# Patient Record
Sex: Male | Born: 1980 | Race: White | Hispanic: No | Marital: Single | Smoking: Former smoker
Health system: Southern US, Community
[De-identification: ages and names within clinical notes are randomized; demographics above are authoritative.]

## PROBLEM LIST (undated history)

## (undated) DIAGNOSIS — K5792 Diverticulitis of intestine, part unspecified, without perforation or abscess without bleeding: Secondary | ICD-10-CM

## (undated) DIAGNOSIS — A0472 Enterocolitis due to Clostridium difficile, not specified as recurrent: Secondary | ICD-10-CM

## (undated) HISTORY — PX: SHOULDER OPEN ROTATOR CUFF REPAIR: SHX2407

## (undated) HISTORY — PX: APPENDECTOMY: SHX54

## (undated) HISTORY — PX: TONSILLECTOMY: SUR1361

---
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
## 2001-07-24 ENCOUNTER — Encounter: Admission: RE | Admit: 2001-07-24 | Discharge: 2001-07-24 | Payer: Self-pay | Admitting: *Deleted

## 2001-08-28 ENCOUNTER — Encounter: Admission: RE | Admit: 2001-08-28 | Discharge: 2001-08-28 | Payer: Self-pay | Admitting: *Deleted

## 2001-09-22 ENCOUNTER — Encounter: Admission: RE | Admit: 2001-09-22 | Discharge: 2001-09-22 | Payer: Self-pay | Admitting: Family Medicine

## 2001-10-19 ENCOUNTER — Encounter: Admission: RE | Admit: 2001-10-19 | Discharge: 2001-10-19 | Payer: Self-pay | Admitting: *Deleted

## 2002-07-09 ENCOUNTER — Inpatient Hospital Stay (HOSPITAL_COMMUNITY): Admission: EM | Admit: 2002-07-09 | Discharge: 2002-07-11 | Payer: Self-pay | Admitting: Psychiatry

## 2002-07-23 ENCOUNTER — Inpatient Hospital Stay (HOSPITAL_COMMUNITY): Admission: EM | Admit: 2002-07-23 | Discharge: 2002-07-24 | Payer: Self-pay | Admitting: Emergency Medicine

## 2002-07-24 ENCOUNTER — Inpatient Hospital Stay (HOSPITAL_COMMUNITY): Admission: EM | Admit: 2002-07-24 | Discharge: 2002-07-27 | Payer: Self-pay | Admitting: Psychiatry

## 2002-07-28 ENCOUNTER — Inpatient Hospital Stay (HOSPITAL_COMMUNITY): Admission: EM | Admit: 2002-07-28 | Discharge: 2002-07-30 | Payer: Self-pay | Admitting: *Deleted

## 2002-08-01 ENCOUNTER — Inpatient Hospital Stay (HOSPITAL_COMMUNITY): Admission: EM | Admit: 2002-08-01 | Discharge: 2002-08-04 | Payer: Self-pay | Admitting: Psychiatry

## 2002-08-10 ENCOUNTER — Encounter: Payer: Self-pay | Admitting: Emergency Medicine

## 2002-08-10 ENCOUNTER — Inpatient Hospital Stay (HOSPITAL_COMMUNITY): Admission: EM | Admit: 2002-08-10 | Discharge: 2002-08-13 | Payer: Self-pay | Admitting: Psychiatry

## 2002-09-18 ENCOUNTER — Emergency Department (HOSPITAL_COMMUNITY): Admission: EM | Admit: 2002-09-18 | Discharge: 2002-09-19 | Payer: Self-pay | Admitting: Emergency Medicine

## 2002-12-28 ENCOUNTER — Emergency Department (HOSPITAL_COMMUNITY): Admission: EM | Admit: 2002-12-28 | Discharge: 2002-12-28 | Payer: Self-pay

## 2002-12-28 ENCOUNTER — Encounter: Payer: Self-pay | Admitting: *Deleted

## 2003-01-24 ENCOUNTER — Emergency Department (HOSPITAL_COMMUNITY): Admission: EM | Admit: 2003-01-24 | Discharge: 2003-01-24 | Payer: Self-pay | Admitting: *Deleted

## 2003-08-21 ENCOUNTER — Emergency Department (HOSPITAL_COMMUNITY): Admission: AD | Admit: 2003-08-21 | Discharge: 2003-08-21 | Payer: Self-pay | Admitting: Family Medicine

## 2003-10-20 ENCOUNTER — Emergency Department (HOSPITAL_COMMUNITY): Admission: AD | Admit: 2003-10-20 | Discharge: 2003-10-21 | Payer: Self-pay | Admitting: Emergency Medicine

## 2004-09-04 ENCOUNTER — Emergency Department (HOSPITAL_COMMUNITY): Admission: EM | Admit: 2004-09-04 | Discharge: 2004-09-04 | Payer: Self-pay | Admitting: Family Medicine

## 2004-10-13 ENCOUNTER — Emergency Department (HOSPITAL_COMMUNITY): Admission: EM | Admit: 2004-10-13 | Discharge: 2004-10-13 | Payer: Self-pay | Admitting: Emergency Medicine

## 2005-04-19 ENCOUNTER — Emergency Department (HOSPITAL_COMMUNITY): Admission: EM | Admit: 2005-04-19 | Discharge: 2005-04-19 | Payer: Self-pay | Admitting: Family Medicine

## 2010-06-25 ENCOUNTER — Emergency Department (HOSPITAL_COMMUNITY): Admission: EM | Admit: 2010-06-25 | Discharge: 2010-06-25 | Payer: Self-pay | Admitting: Emergency Medicine

## 2010-11-25 LAB — CLOSTRIDIUM DIFFICILE EIA: C difficile Toxins A+B, EIA: NEGATIVE

## 2011-01-29 NOTE — Discharge Summary (Signed)
[name], [name] NO.:  [id number]   MEDICAL RECORD NO.:  [id number]                   PATIENT TYPE:  IPS   LOCATION:  0302                                 FACILITY:  [facility]   PHYSICIAN:  Geoffery Lyons, M.D.                   DATE OF BIRTH:  1981-06-03   DATE OF ADMISSION:  07/28/2002  DATE OF DISCHARGE:  07/30/2002                                 DISCHARGE SUMMARY   CHIEF COMPLAINT AND PRESENT ILLNESS:  This was the second admission to [facility] for this 30 year old single white male  voluntarily admitted.  He had a history of having an angry outburst at home  with his grandmother and fiancee.  He punched a hole in the wall.  He tried  to change his behavior but stated that his family was not.  He was very  discouraged, threatened to overdose but stated he would not.  He had no  history of physical violence.  Sleep had been fair.  Appetite had been good.  He denied any psychotic symptoms.  He promised safety.  He was recently  discharged three days prior to this admission for an overdose on Zoloft.   PAST PSYCHIATRIC HISTORY:  This was the second time at [facility].  He was discharged on Friday prior to this admission.  He had no  other admissions in the past.   SUBSTANCE ABUSE HISTORY:  He denied the use or abuse of any substances.   PAST MEDICAL HISTORY:  Noncontributory.   MEDICATIONS:  Trileptal 150 mg, had been compliant.   PHYSICAL EXAMINATION:  Physical examination was performed, failed to show  any acute findings.   MENTAL STATUS EXAM:  Mental status exam revealed an alert, young adult male,  cooperative, good eye contact, casually dressed.  Speech was clear.  Mood  was depressed and angry.  Affect was depressed.  Thought processes:  Coherent; no evidence of psychosis, no suicidal or homicidal ideas, no  auditory and visual hallucinations, no paranoia.  Cognitive: Cognition was  well  preserved.   ADMISSION DIAGNOSES:   AXIS I:  1. Depressive disorder, not otherwise specified.  2. Impulse control disorder, not otherwise specified.   AXIS II:  Rule out personality disorder, not otherwise specified.   AXIS III:  No diagnosis.   AXIS IV:  Moderate.   AXIS V:  Global assessment of functioning upon admission 35, highest global  assessment of functioning in the last year 65-70.   HOSPITAL COURSE:  He was admitted and started in intensive individual and  group psychotherapy.  He continued to work on mood and coping skills and  anger management.  He was aware that if he kept losing control, he might end  up hurting someone.  He worked on self, on Pharmacologist.  On November 17,  he was in full contact  with reality.  He was able to identify issues with  the grandmother that led to the outburst.  He felt that he has been  cooperative during the hospitalization.  He was willing to continue the  medication as well as work on anger management once discharged.  As he was  much more stable and had created more insight and had worked on his issues,  we discharged to outpatient followup.   DISCHARGE DIAGNOSES:   AXIS I:  1. Impulse control disorder, not otherwise specified.  2. Depressive disorder, not otherwise specified.   AXIS II:  Personality disorder, not otherwise specified.   AXIS III:  No diagnosis.   AXIS IV:  Moderate.   AXIS V:  Global assessment of functioning upon discharge 50-55.   DISCHARGE MEDICATIONS:  Trileptal 150 mg three times a day.   FOLLOW UP:  He was to follow up with Hipolito Bayley, M.D., and __________.                                                 Geoffery Lyons, M.D.    IL/MEDQ  D:  08/29/2002  T:  08/30/2002  Job:  [id number]

## 2011-01-29 NOTE — H&P (Signed)
NAMECHANCELLOR, VANDERLOOP NO.:  [id number]   MEDICAL RECORD NO.:  [id number]                   PATIENT TYPE:  IPS   LOCATION:  0302                                 FACILITY:  [facility]   PHYSICIAN:  Hipolito Bayley, M.D.               DATE OF BIRTH:  Jul 14, 1981   DATE OF ADMISSION:  07/28/2002  DATE OF DISCHARGE:                         PSYCHIATRIC ADMISSION ASSESSMENT   IDENTIFYING INFORMATION:  This is a 30 year old single white male  voluntarily admitted on July 28, 2002.   HISTORY OF PRESENT ILLNESS:  The patient presents with a history of having  an angry outburst at home with his grandmother and fiance.  He states he  punched three holes in the wall.  He states he is trying to change his  behavior but states that his family is not.  He feels very discouraged.  He  had threatened to overdose but states that he would not.  No history of  physical violence.  His sleep has been fair.  His appetite has been good.  He denies any psychotic symptoms.  Promises safety.  The patient was  recently discharged three days ago prior to this admission for an overdose  on Zoloft.   PAST PSYCHIATRIC HISTORY:  Second visit to [facility].  Recently discharged on Friday prior to this admission.  No other inpatient  admissions at other facilities.  He has a history of a suicide attempt where  he overdosed on Zoloft.  Since his last admission, he was supposed to see  Dr. Lourdes Sledge for follow-up.   SOCIAL HISTORY:  This is a 30 year old single white male.  Has a 61-year-old,  who is with the mother.  He reports he has a fiance who is expecting a  child.  She is currently three months pregnant.  Living with his  grandmother.  He works at [employer].  He has completed the 10th grade.  No  legal problems.   FAMILY HISTORY:  None.   ALCOHOL/DRUG HISTORY:  The patient smokes.  Denies any alcohol or substance  abuse.   PRIMARY CARE PHYSICIAN:  None.  States he  would go to Urgent Care for any  physical illnesses.   MEDICAL PROBLEMS:  None.   MEDICATIONS:  Was started on Trileptal 150 mg b.i.d.  States he has been  compliant.   ALLERGIES:  No known allergies.   PHYSICAL EXAMINATION:  Performed at last hospitalization when patient  overdosed with no significant findings.  The patient appears as a healthy  but somewhat overweight male without any complaints.  Vital signs were  temperature 98.71, heart rate 14, respirations 20, blood pressure 128/77.   MENTAL STATUS EXAM:  He is an alert, young adult male, cooperative with good  eye contact and casually dressed.  Speech is clear.  Mood is depressed and  angry.  Affect is depressed.  Thought processes are coherent.  There is  no  evidence of psychosis.  No suicidal or homicidal ideation.  No auditory or  visual hallucinations.  No paranoia.  Cognitive function intact.  Memory is  good.  Judgment poor.  Insight fair.  Poor impulse control.    DIAGNOSES:   AXIS I:  Depressive disorder not otherwise specified.   AXIS II:  Deferred.   AXIS III:  None.   AXIS IV:  Problems with primary support group, other psychosocial problems.   AXIS V:  Current 35; this past year 65-70.   PLAN:  Voluntary admission for depression and suicidal ideation, explosive  behavior.  Contract for safety.  Check every 15 minutes.  Will resume his  medications.  Will increase coping skills to manage behavior.  To follow up  with Dr. Lourdes Sledge.  To be medication-compliant.   TENTATIVE LENGTH OF STAY:  Two to three days.     Landry Corporal, N.P.                       Hipolito Bayley, M.D.    JO/MEDQ  D:  07/29/2002  T:  07/29/2002  Job:  [id number]

## 2011-01-29 NOTE — Discharge Summary (Signed)
NAMEBRANNAN, Tyler NO.:  [id number]   MEDICAL RECORD NO.:  [id number]                   PATIENT TYPE:  IPS   LOCATION:  0503                                 FACILITY:  [facility]   PHYSICIAN:  Geoffery Lyons, M.D.                   DATE OF BIRTH:  [birth year]   DATE OF ADMISSION:  08/10/2002  DATE OF DISCHARGE:  08/13/2002                                 DISCHARGE SUMMARY   CHIEF COMPLAINT AND PRESENT ILLNESS:  This was one of several admissions to  [facility] for this 30 year old white single male,  voluntarily admitted.  He had a bad day over the Thanksgiving holiday,  several stressors including conflict with his great grandmother and  girlfriend.  He reported to the nurse that he had been looking for a gun  prior to overdosing on Tylenol, which he at the end of the day.  He was  unable to cope with his feelings of frustration over a day of conflict.  He  did not take his medications all day.  He felt that they helped him to  maintain some stability.  He endorsed suicidal ideas.  He denied any  homicidal ideas, or auditory and visual hallucinations.   PAST PSYCHIATRIC HISTORY:  This was the fifth time at [facility] within the past 30 days.  He had previous history of suicide attempt.   SUBSTANCE ABUSE HISTORY:  There was no evidence of substance abuse.   PAST MEDICAL HISTORY:  He denied history of any major medical conditions.   MEDICATIONS:  Trileptal 300 mg three times a day; questionable compliance.   PHYSICAL EXAMINATION:  Physical examination was performed, failed to show  any acute findings.   MENTAL STATUS EXAM:  Mental status exam revealed a well nourished, well  developed, short, heavy built white male, fully alert, tattoos, no acute  distress, irritable, composed with appropriate affect.  Speech was normal.  Mood was euthymic and depressed.  Thought processes were dominated about  concerns that  everyone had caused him to be in an bad mood so that he was on  the unit.  He felt that he was doing well on the medication but got angry.  Thought process was logical; no evidence of homicidal ideation, no suicidal  ideas, no auditory and visual hallucinations.  Cognitive: Cognition was well  preserved.   ADMISSION DIAGNOSES:   AXIS I:  1. Mood disorder, not otherwise specified.  2. Impulse control disorder, not otherwise specified.   AXIS II:  Personality disorder, not otherwise specified, with borderline  features.   AXIS III:  1. Status post overdose.  2. Elevated liver enzymes.   AXIS IV:  Moderate.   AXIS V:  Global assessment of functioning upon admission 38, highest global  assessment of functioning in the last year 76.   LABORATORY DATA:  Other  laboratory workup: CBC was within normal limits.  Blood chemistries were within normal limits.  SGOT 75, SGPT 83.   HOSPITAL COURSE:  He was admitted and started in intensive individual and  group psychotherapy.  He was kept on his Trileptal that was increased to 300  mg twice a day and 450 mg at bedtime.  He was given a Z-Pak for upper  respiratory infection.  Just shortly after he was admitted, he wanted to  leave, the same attitude as seen before, not really willing to get into any  sort of treatment, claiming that his relatives had given him emergency  appointment last Sunday.  He was anxious, irritable, no insight.  Grandmother called here to state that he needed to stay longer but was not  wanting [name] to know that she had called.  He evidenced a lot of  manipulative behavior, minimized symptoms.  Family would not confront him  with any of the information that they were giving the staff but at the same  time, when he asked the family, they denied that they said anything.  We had  a family session.  He developed a cold which made him worse.  On December 1,  he was in full contact with reality, saying that he was not  going to hurt  himself.  Family told him that if he did this again, he would go to [facility].  There was an appointment with [facility].  The family was explained the terms of the commitment  criteria and that he might benefit from going to [facility] the  next time.   DISCHARGE DIAGNOSES:   AXIS I:  1. Mood disorder, not otherwise specified.  2. Impulse control, not otherwise specified.   AXIS II:  Personality disorder, not otherwise specified, with borderline  features.   AXIS III:  1. Status post overdose.  2. Increased liver enzymes.   AXIS IV:  Moderate.   AXIS V:  Global assessment of functioning upon discharge 60.   DISCHARGE MEDICATIONS:  1. Trileptal 300 mg three times a day.  2. Zithromax 250 mg one daily for four days.   FOLLOW UP:  He was to continue treatment at [facility] and Dr. Lang Snow.                                                 Geoffery Lyons, M.D.    IL/MEDQ  D:  09/26/2002  T:  09/26/2002  Job:  [id number]

## 2011-01-29 NOTE — H&P (Signed)
NAMEDENZIL, MCEACHRON NO.:  [id number]   MEDICAL RECORD NO.:  [id number]                   PATIENT TYPE:  IPS   LOCATION:  0302                                 FACILITY:  [facility]   PHYSICIAN:  Geoffery Lyons, M.D.                   DATE OF BIRTH:  1980/11/12   DATE OF ADMISSION:  08/01/2002  DATE OF DISCHARGE:                         PSYCHIATRIC ADMISSION ASSESSMENT   DATE OF ASSESSMENT/>  August 02, 2002   PATIENT IDENTIFICATION:  This is a 30 year old single male who is Caucasian,  voluntary admission.   HISTORY OF PRESENT ILLNESS:  This patient presented to the hospital with his  grandmother with whom he does not reside requesting admission, stating that  he could not be safe, fearing that he would harm himself.  Apparently he had  an anger outburst after they had been arguing and his grandmother had been  urging him to get a job and telling him that he could not continue to live  with his great-grandmother.  During his anger outburst, he became agitated,  he broke a door and was highly agitated.  During the admission process, he  became agitated and threatened the staff with violence and then subsequently  settled down and was cooperative.  The patient endorses angry feelings,  feeling out of control and frustrated during the argument.  He denies any  visual hallucinations or auditory hallucinations.  He reports that he has  been medication compliant since leaving [facility]  on November 17.   PAST PSYCHIATRIC HISTORY:  This is the patient's fourth admission to [facility].  The patient also has a history of prior  admissions to [facility] in 1999.  He does have a history of prior  suicide attempts by overdose and a history of impulsive anger.   SUBSTANCE ABUSE HISTORY:  The patient does smoke cigarettes but denies any  other substance abuse, no record of alcohol or other substance  use.   PAST MEDICAL HISTORY:  The patient has no regular primary care Dayson Aboud.  Medical problems: None; the patient denies any and claims that he is  generally healthy.   MEDICATIONS:  The only medication the patient takes is Trileptal 150 mg p.o.  t.i.d. and he claims he has been compliant with this.   DRUG ALLERGIES:  None.   REVIEW OF SYSTEMS:  Review of systems is remarkable for the patient's report  of some history of confusion and some short-term memory loss whenever he  gets agitated.  He gets a little confused about facts and feels that he  cannot remember things after episodes of agitation.  He denies any seizures  or blackouts or dizziness.  The patient denies any risk for sexually  transmitted diseases.  He states he has had some episodes of elevated blood  pressure in the past but not  regularly and has never been treated with  medications for this.   PHYSICAL EXAMINATION:  GENERAL:  This is a well nourished, well developed,  stocky built male who is medium complected.  Hygiene is good and  appropriate.  He is 5 feet 7 inches tall, weighs 224 pounds on admission.  He is fully alert.  VITAL SIGNS:  Temperature 97.6, pulse 106, blood pressure 138/90 upon  admission.  This morn ring his blood pressure is 131/68.  HEENT:  Head is normocephalic, atraumatic.  EENT: Exam is normal.  Sclerae  are nonicteric.  Hearing: Intact.  No rhinorrhea.  Oropharynx: In good  condition, noninjected.  NECK:  Supple, full range of motion, no thyromegaly.  CARDIOVASCULAR:  S1 and S2 heard; no clicks, murmurs, or gallops.  Heart  sounds are normal.  Radial pulse synchronous with apical pulse.  LUNGS:  Clear to auscultation.  ABDOMEN:  Rounded, quiet, nontender.  GENITALIA:  Deferred.  EXTREMITIES:  Without edema, warm and pink.  SKIN:  Medium tone.  The patient is heavily tattooed across his arms and  lower extremities.  No evidence of rashes.  NEUROLOGIC:  Cranial nerves II-XII are intact.   Grip strength is  symmetrical.  Motor movements are smooth.  Gait is normal with normal arm  swing.  Balance is good.  Exam is limited by the patient's cooperation.   LABORATORY DATA:  Diagnostic studies reveal essentially normal CBC with a  hemoglobin 17.4, hematocrit 51.3, platelets 341,000, MCV normal at 88.2.  Electrolytes are within normal limits.  BUN 10, creatinine 0.9.  Urinalysis  was normal.  Urine drug screen is currently pending.  We elected not to  repeat his thyroid screen, which was previously normal.   SOCIAL HISTORY:  The patient has a 10th grade education.  He was abandoned  by his parents when he was young and has been raised by his grandmother and  other relatives.  He currently resides in [place], [place], with  his great-grandmother and is employed on a variable basis.  He currently has  a girlfriend who is pregnant in her first trimester and he reported  considerable stress on previous admissions related to his concerns about  being a good father to this future child.  He does have a history of  speeding tickets and has lost his driver's license.   FAMILY HISTORY:  The patient denies any history of mental illness or  substance abuse.   MENTAL STATUS EXAM:  This is a overweight, large built male who is  attempting to be cooperative but is reclusive with poor eye contact.  He is  lying in bed with the covers up around his ears, keeps his face turned into  the pillow.  He warms as little bit with conversation but is generally  rather resistant to the interview.  Speech is normal.  Mood is irritable.  He is also somewhat embarrassed and ashamed of being here.  Thought process  is logical without psychosis; no evidence of suicidal ideation today, no  evidence of homicidal ideation.  He is not paranoid but is more embarrassed  about being here.  Thought content is dominated by his irritability with his family members who are insisting that he get a job and go  to work and also  his behavior at home, which has been rather angry and sullen from what we  can gather.  Cognitive: Intact and oriented x 3.  Intelligence is average.  Insight: Fair.  Judgment and  impulse control: Within normal limits.   ADMISSION DIAGNOSES:   AXIS I:  Mood disorder, not otherwise specified.   AXIS II:  Deferred.   AXIS III:  No diagnosis.   AXIS IV:  Moderate conflict with family.   AXIS V:  Current 38, past year 37.   INITIAL PLAN OF CARE:  Plan is to voluntarily admit the patient to evaluate  his suicidal ideation and his temper outbursts with a goal of alleviating  any suicidal ideation, strengthening his coping mechanism.  We have elected  to start him in intensive group and individual psychotherapy immediately and  he has not been yet participating in groups this morning and we will work on  that.  Meanwhile, we will check a urine drug screen on him which is  currently pending.  We plan on continuing his Trileptal at its current dose  of 150 mg p.o. t.i.d. as his only medication at this point but we will  evaluate the need for an antidepressant after we get some additional history  from his family.  Meanwhile, we are going to continue referrals to help him  with anger management and to monitor his medications on an ongoing basis.   ESTIMATED LENGTH OF STAY:  Two to three days.     Margaret A. Scott, N.P.                   Geoffery Lyons, M.D.    MAS/MEDQ  D:  08/03/2002  T:  08/03/2002  Job:  [id number]

## 2011-01-29 NOTE — Discharge Summary (Signed)
NAMESHAFIN, POLLIO NO.:  [id number]   MEDICAL RECORD NO.:  [id number]                   PATIENT TYPE:  IPS   LOCATION:  0500                                 FACILITY:  [facility]   PHYSICIAN:  Geoffery Lyons, M.D.                   DATE OF BIRTH:  [birth year]   DATE OF ADMISSION:  07/09/2002  DATE OF DISCHARGE:  07/11/2002                                 DISCHARGE SUMMARY   CHIEF COMPLAINT AND PRESENT ILLNESS:  This was the second admission to [facility] for this 30 year old single white male voluntarily  admitted.  He claimed that he was coming to get some help with counseling  and medication.  He called the police to have himself taken to [facility] requesting counseling as he was having suicidal thoughts and had to  resist the urge to jump out of a car.  Specific stressor is that his 17-year-  old girlfriend was pregnant.  He was frustrated with her waivering on her  commitment to him as well as her moodiness.  He was afraid he was going to  lose control.  Endorsed a week of intense worrying, initial and terminal  insomnia, sleeping 4-5 hours per night.  When arguing the last weekend  before the admission, he relapsed on alcohol, drinking a 12-pack of beer  after being sober for seven months.  Also relapsed on marijuana after having  none in 2-1/2 weeks.  Also endorsed depressed mood, irritability and anger.   PAST PSYCHIATRIC HISTORY:  Dr. Lourdes Sledge one year prior to this admission on  an outpatient basis.  Second time at [facility].  Had been in [facility] at age 26.  Previous history of cutting.  Has been on lithium and  BuSpar.   ALCOHOL/DRUG HISTORY:  Regular alcohol abuse and marijuana abuse.  Denies  any other substances.   PAST MEDICAL HISTORY:  Denies history of any major medical conditions.  He  was not taking any medications.   PHYSICAL EXAMINATION:  Performed and failed to show any acute findings.   MENTAL STATUS EXAM:  Overweight, short, stocky, young male with tattoos on  both arms.  Alert and in no acute distress.  Cooperative.  Speech was normal  and spontaneous.  Anxious to tell his story.  Mood was anxiety and  depression.  Affect was broad.  Thought process logical, coherent and  relevant.  No suicidal ideation.  No homicidal ideation.  Cognition well-  preserved.   ADMISSION DIAGNOSES:   AXIS I:  1. Major depression, single episode.  2. Marijuana and alcohol abuse.   AXIS II:  No diagnosis.   AXIS III:  No diagnosis.   AXIS IV:  Moderate.   AXIS V:  Global Assessment of Functioning upon admission 34; highest Global  Assessment of Functioning in the last year 68.   LABORATORY  DATA:  CBC showed hemoglobin to be 18.8.  Blood chemistries were  within normal limits.  Thyroid profile was within normal limits.  Drug  screen was negative for substances of abuse.   HOSPITAL COURSE:  He was placed on Zoloft 25 mg per day as he tolerated the  Zoloft.  The plan was to increase to 50 mg.  He was involved in the  individual and group psychotherapy.  He gained some more insight.  Admitted  he was feeling overwhelmed with a lot of stress.  He felt very overwhelmed  but, at the time of this evaluation, he denied any suicidal ideation.  He  felt like he needed to get out of the hospital as he had to go back to work  and he just got a job at [employer] and, if he was not there on that  day, he was going to lose his job.  He said he had a hard time landing this  job so he wanted to be out.  He evidenced  increased insight on his  situation, has worked on Pharmacologist, was willing to see Dr. Lourdes Sledge.  As he was guarantee safety and he had a follow-up plan in place, had  increased insight and coping skills, we went ahead and discharged to  outpatient follow-up.   DISCHARGE DIAGNOSES:   AXIS I:  1. Major depression, single episode.  2. Alcohol and marijuana abuse.    AXIS II:  No diagnosis.   AXIS III:  No diagnosis.   AXIS IV:  Moderate.   AXIS V:  Global Assessment of Functioning upon discharge 60-65.   DISCHARGE MEDICATIONS:  Zoloft 25 mg per day to increase up to 50 mg per  day.   FOLLOW UP:  Dr. Lourdes Sledge at the [facility] Outpatient  Department.                                               Geoffery Lyons, M.D.    IL/MEDQ  D:  08/07/2002  T:  08/08/2002  Job:  [id number]

## 2011-01-29 NOTE — Discharge Summary (Signed)
NAMEGARRICK, [name] NO.:  [id number]   MEDICAL RECORD NO.:  [id number]                   PATIENT TYPE:  IPS   LOCATION:  0301                                 FACILITY[facility]  [facility]   PHYSICIAN:  Carolanne Grumbling, M.D.                 DATE OF BIRTH:  1981-02-25   DATE OF ADMISSION:  07/24/2002  DATE OF DISCHARGE:  07/27/2002                                 DISCHARGE SUMMARY   PATIENT IDENTIFICATION:  The patient is a 30 year old male.   INITIAL ASSESSMENT AND DIAGNOSIS:  The patient was admitted after he had  taken an overdose of Zoloft.  He reported he had an argument with his  girlfriend, and consequently took the overdose.  He had just been in this  hospital two weeks prior to this admission for similarly impulsive actions.  He said at the time of admission that he did not need to be here, that it  was a waist of time, he was not suicidal, and he was ready to go home by the  time he was admitted to the hospital.   HOSPITAL COURSE:  He was only in the hospital for three days.  He  insistently denied any suicidal ideation.  Each day was an effort to talk  him out of trying to leave and wondering why he could not leave since he was  not suicidal but he seemed to know hospital regulations very well but he was  at least kept until three days.  At that point, because he had made no  suicidal thoughts, he did have a session with his girlfriend and his  grandmother and both of them were comfortable having him home again, he was  discharged home.   MENTAL STATUS EXAM:  Mental status at the time of the initially evaluation  revealed an alert, oriented young man, who was appropriately dressed and  groomed.  Speech was normal.  Mood was irritable.  He seemed to be logical  and goal directed.  There was no evidence of any hallucinations or other  psychosis.  He seemed to be at least average intelligence.   ADMISSION DIAGNOSES:   AXIS I:  1. Mood disorder, not  otherwise specified.  2. Rule out bipolar disorder.   AXIS II:  Deferred.   AXIS III:  Status post suicidal attempt.   AXIS IV:  Moderate.   AXIS V:  30/74.   All indicated laboratory examinations were within normal limits or  noncontributory.   POST-HOSPITAL CARE PLANS:  He was referred back to Dr. Lourdes Sledge, who is his  outpatient doctor.  At the time of discharge he was taking Trileptal 150 mg  twice daily. There were no restrictions based on his habit, on his diet or  his activity.    FINAL DIAGNOSES:   AXIS I:  Mood disorder, not otherwise specified.   AXIS II:  Rule out  personality disorder.   AXIS III:  Healthy.   AXIS IV:  Moderate.   AXIS V:  50/60.                                                   Carolanne Grumbling, M.D.    GT/MEDQ  D:  08/13/2002  T:  08/13/2002  Job:  [id number]

## 2011-01-29 NOTE — H&P (Signed)
[name], [name] NO.:  [id number]   MEDICAL RECORD NO.:  [id number]                   PATIENT TYPE:  IPS   LOCATION:  0500                                 [facility]ACILITY[facility]  [facility]   PHYSICIAN:  [name], M.D.              DATE OF BIRTH:  1981-03-10   DATE OF ADMISSION:  07/09/2002  DATE OF DISCHARGE:                         PSYCHIATRIC ADMISSION ASSESSMENT   IDENTIFYING INFORMATION:  This is a 30 year old single white male who is a  voluntary admission.   CHIEF COMPLAINT:  I came here to get some counseling and some help with  medications.   HISTORY OF PRESENT ILLNESS:  This patient called the police to have himself  taken to [name] yesterday requesting some counseling  and help after he had had suicidal thoughts over the weekend and then had to  resist the urge to jump out of a car.  He found out within the past two  weeks that his 42 year old girlfriend is pregnant.  She is waivering on her  commitment to him and he is frustrated with her moodiness and change ability  and feels at a loss to control the situation.  He attributes some of this to  immaturity on her part but is clearly frustrated with his situation.  He  endorses one week of intense worry, initial and terminal insomnia, sleeping  only about 4-5 hours per night.  When he was arguing with her this past  weekend, he relapsed on alcohol, drinking a 12-pack of beer after being  sober for several months.  He also relapsed on marijuana after having none  in 2-1/2 weeks.  He had previously used marijuana over the past few years on  a regular basis.  He denies other substance abuse.  The patient also  endorses depressed mood and intense frustration and irritability with  anxiety over the past week.  He denies auditory or visual hallucinations or  alcohol withdrawal symptoms.  The patient also denies past history of manic  behaviors or elevated mood.   PAST  PSYCHIATRIC HISTORY:  The patient has no current [name].  He has seen  Dr. Lourdes Stephenson approximately one year ago on an outpatient basis here at  [name].  This is the patient's second  [name] admission.  He has a history of a prior admission  approximately four years ago at age 63 for reasons that are unclear and  another prior hospitalization to [name] at age 68.  He does have  some history of cutting himself previously but denies that he has done any  of this recently.  He has been previously diagnosed with bipolar disorder  and, at one time, was managed on lithium and BuSpar, which he has not taken  in over a year.  He reports that he has taken medications in the past, which  caused him to gain  considerable weight and one of his priorities is to avoid  medications that cause weight gain.   SOCIAL HISTORY:  This is a single white male with a 10th grade education.  He has been raised by his grandparents after being abandoned by his parents  at one point in his life.  He recently suffered the death of his great-  grandfather in August of 2003, which he claims was a serious emotional loss  for him.  He lacks close family support and feels he is unable to talk to  his grandparents about things of deep concern to him.  He does have one  child, who is approximately 70 years old, who lives with the child's mother.  The patient feels that he is somehow repeating history by again having a  girlfriend, who is pregnant, and he fears that this girlfriend will somehow,  because of her inconsistency, keep him from being able to be a good father  to this child.  The patient does have a history of some previous speeding  tickets and is currently without a driver's license.   FAMILY HISTORY:  Unclear.   ALCOHOL/DRUG HISTORY:  The patient has a history of regular alcohol abuse  and regular marijuana abuse but he denies ever having any withdrawal   symptoms from alcohol.  He denies abusing any other substances or opiates.   PAST MEDICAL HISTORY:  The patient has no regular primary care [name].  He  denies any current medical problems.   CURRENT MEDICATIONS:  Denies.   ALLERGIES:  None.   POSITIVE PHYSICAL FINDINGS:  The patient's full PE is pending.  GENERAL:  He is a stocky-built, healthy-appearing, 30 year old male, who was  heavily tattooed along his arms, forearms and his neck.  VITAL SIGNS:  On admission to the unit, temperature 97.5, pulse 114,  respirations 20, blood pressure 125/88.  He is approximately 5 feet 7 inches  tall and weighs 226 pounds as of this morning.   LABORATORY DATA:  Hemoglobin elevated at 18.4, hematocrit elevated at 55.1.  The patient does admit that he has not been drinking fluids adequately.  His  WBC is elevated at 14,900.  No differential is available at this time.  The  patient denies any somatic symptoms.  Denies any fevers, chills or signs of  infections.  Platelet count is 428,000.  Electrolytes are within normal  limits with potassium of 4.5, sodium of 141, BUN 9, creatinine 1.2.  Liver  enzymes are within normal limits with a total bilirubin of 0.7.  SGOT 25,  SGPT 40.  His thyroid panel is currently pending.   MENTAL STATUS EXAM:  This is an overweight, short, stocky, young male who  appears to be his stated age of 57.  He is fully alert and in no acute  distress.  He is cooperative and polite with a good focus and is able to  track conversation well.  His affect is full.  Speech is normal and  spontaneous.  He seems quite anxious to tell his story and his concerns  about his relationship with his girlfriend and his frustrations with her  moodiness.  Mood is mildly depressed.  His thought process is logical  without deficits.  No evidence of homicidal ideation.  He does have vague suicidal ideation without any specific plan.  No evidence of psychosis.  His  thought content is dominated  by his concerns that this pregnancy is  repeating history for him and that he may not  have a chance to be a good  father to this child as he feels deprived from the opportunity to be a good  husband and father with the previous pregnancy.  Cognitively, he is intact  and oriented x 3.  Intelligence is average to above average.  Insight fair.  Impulse control and judgment within normal limits.   DIAGNOSES:   AXIS I:  1. Adjustment reaction with depressed mood.  2. Cannabis abuse.   AXIS II:  Deferred.   AXIS III:  None.   AXIS IV:  Moderate to severe (conflict with his girlfriend with lack of  adequate primary support).   AXIS V:  Current 34; past year 18.   PLAN:  Voluntarily admit the patient with 15-minute checks in place with our  goal to alleviate his suicidal ideation.  We have elected to enroll him  immediately in intensive group therapy along with individual psychotherapy  and he has been participating fully today.  We have also elected to start  him on Zoloft 25 mg p.o. daily and he is tolerating this well.  We have  talked about the risks and benefits of this medication, including its  potential for sexual side effects and weight gain and he is in agreement to  go ahead and start this medication.  We will also provide him with Seroquel  25 mg p.o. q.6h. p.r.n. should he have any additional agitation but he has  not required p.r.n. doses at this time.  He also is allowed Ambien 10 mg  q.h.s. p.r.n. basis for insomnia.  We, at this point, are going to presume  that his leukocytosis is secondary to some mild dehydration since he has no  focal symptoms and no sides of fever or infection clinically.  So, at this  point, we will not proceed with any further testing of this unless he shows  elevated temperature or other clinical signs.   ESTIMATED LENGTH OF STAY:  Three to four days.     Margaret A. Stephannie Peters                   [name], M.D.    MAS/MEDQ  D:   07/10/2002  T:  07/10/2002  Job:  [id number]

## 2011-01-29 NOTE — Discharge Summary (Signed)
[name], [name] NO.:  [id number]   MEDICAL RECORD NO.:  [id number]                   PATIENT TYPE:  INP   LOCATION:  3316                                 FACILITY:  [facility]   PHYSICIAN:  Fransisco Hertz, M.D.               DATE OF BIRTH:  06-Mar-1981   DATE OF ADMISSION:  07/23/2002  DATE OF DISCHARGE:  07/24/2002                                 DISCHARGE SUMMARY   CONSULTATIONS:  Antonietta Breach, M.D., psychiatry.   DISCHARGE DIAGNOSES:  1. Zoloft overdose.  2. Major depressive disorder, recurrent, severe.  3. Polysubstance dependence.   DISCHARGE MEDICATIONS:  Per psychiatry.   DISPOSITION:  The patient will be transferred and involuntarily committed to  [facility] for further evaluation and management.  He will  be followed there by the psychiatric team.  Pending discharge from the  psychiatric unit, the patient has been given the number to the outpatient  clinic and is instructed that he can call to set up an inpatient appointment  with Dr. Kennith Gain at his convenience.   Issues for followup include further evaluation and management of patient's  psychiatric conditions.   PROCEDURES:  None.   BRIEF HISTORY:  The patient is a 30 year old Caucasian male with an  extensive past psychiatric history including depression, questionable  bipolar disorder, questionable borderline personality, and questionable ADHD  who was brought to the emergency department by EMS secondary to an overdose  of Zoloft.  The patient reports that he was at home the morning of admission  when he got into an argument with his girlfriend on the telephone.  The  patient stated that he was tired and dealing with it, stopped, and  impulsively took approximately forty 50 mg tablets of Zoloft.  Reportedly he  was caught by his maternal grandmother who called EMS.  The patient was,  therefore, transferred to the [facility] Emergency Room for  further  evaluation.   The patient reported feeling a jittery sensation with heart racing and mild  abdominal pain but denied any other symptoms at the time of the initial  evaluation.  Of note, the patient reported many stressors in life including  frequent arguments with his family and his girlfriend.  He stated that his  girlfriend was pregnant with his child and did not listen to him.  Per the  patient, the majority of his actions recently have been impulsive.  He often  acts out and occasionally has rage, but he denied any homicidal ideations.  He is uncertain if he was actually attempting to hurt and/or kill himself by  taking these medications.   PHYSICAL EXAMINATION ON ADMISSION:  VITAL SIGNS:  Temperature 98.3, pulse  115, blood pressure 132/70, respiratory rate 18, O2 saturation 97% on room  air.  GENERAL:  The patient was mildly agitated.  There no acute distress.  He was  alert and oriented x 3.  HEENT:  Pupils are equal, round, and reactive to light and accommodation.  Extraocular muscles intact.  Sclerae clear.  Nares patent.  TMs clear  bilaterally.  Oropharynx had evidence of charcoal.  NECK:  No lymphadenopathy, JVD, or bruits appreciated.  LUNGS:  Clear to auscultation bilaterally.  CARDIOVASCULAR:  Tachycardic with regular rhythm.  There were no murmurs,  gallops, or rubs appreciated.  ABDOMEN:  Mildly obese.  Soft, nondistended with mild tenderness to  palpation diffusely.  There was no rebound or guarding.  EXTREMITIES:  Without clubbing, cyanosis, or edema.  Good capillary refill.  SKIN:  The patient has multiple tattoos distributed throughout his upper  extremities.  LYMPH:  No lymphadenopathy in cervical or axillary regions.  NEUROLOGIC:  Cranial nerves II-XII intact.  No focal nor sensory deficits  appreciated.  The patient demonstrated 5/5 strength throughout.  PSYCHIATRIC:  A 30 year old Caucasian male who appeared his stated age.  He  was dressed in a  hospital gown at the time of exam.  His behavior was  appropriate.  His speech was normal but somewhat monotone.  His mood was  described as edgy.  His affect was somewhat constricted.  His thought  process was within normal limits.  Thought content was within normal limits,  though his insight and judgment were poor.  His concentration was  reasonable.   ADMISSION LABORATORY DATA:  White blood cell count 12.3, hemoglobin 10.4,  hematocrit 32.9, platelets 353.  Sodium 141, potassium 4.2, chloride 104,  bicarb 28, BUN 9, creatinine 0.9, glucose 138.  Total bilirubin 1.5,  alkaline phosphatase 77, AST 22, ALT 32, protein 7.9, albumin 4.3, calcium  9.7.  Acetaminophen level was less than 10.  Salicylate was less than 4.  Urine drug screen was negative.  TCA was Negative.  ETOH was negative.  Urinalysis was normal.   EKG revealed sinus tachycardia with normal axis and intervals.  There was no  QT prolongation.  There were no ST changes consistent with ischemia.   HOSPITAL COURSE:  1. ZOLOFT OVERDOSE:  The patient was monitored closely in the stepdown unit     for signs and symptoms of overdose including agitation, drowsiness,     confusion, ataxia, vertigo, tremor, delusions, hallucinations, nausea,     vomiting, seizures, and tachycardia.  The patient did not give a history     of any other ingestion.  He was not felt to be high risk of serotonin     syndrome; however, these symptoms were also monitored.  The patient did     quite well overnight without any complications secondary to the overdose.     The following morning, he was felt to be medically stable.  He did     undergo a psychiatric evaluation by Dr. Jeanie Sewer.  It was Dr.     Providence Crosby impression that he did indeed have major depressive disorder,     recurrent, severe, and was still at risk to harm himself.  Dr. Jeanie Sewer     performed a petition for commitment to allow for further inpatient    enfolding environment and  further evaluation and therapy of his major     depressive disorder and suicide attempt.  Therefore, the patient will be     transferred to [facility] for further management.    DISCHARGE LABORATORY DATA:  Available labs on this patient were drawn on  07/24/2002 and were as follows.  White blood cell count 10.9,  hemoglobin  16.6, hematocrit 48.2, platelets 346.  Sodium 139, potassium 3.5, chloride  105, bicarb 27, BUN 7, creatinine 0.8, glucose 110, calcium 8.7, total  bilirubin 0.9.  Other hepatic function tests were within normal limits.     Kennith Gain, M.D.                  Fransisco Hertz, M.D.    CM/MEDQ  D:  07/24/2002  T:  07/24/2002  Job:  [id number]   cc:   Outpatient Clinic   Fransisco Hertz, M.D.  [address]  [place] [address]  Fax: [phone]   Antonietta Breach, M.D.  [address]  [place], [place] [address]  Fax: [phone]

## 2011-01-29 NOTE — Discharge Summary (Signed)
NAME:  [name], [name] NO.:  [id number]   MEDICAL RECORD NO.:  [id number]                   PATIENT TYPE:  IPS   LOCATION:  0302                                 FACILITY:  [facility]   PHYSICIAN:  Jeanice Lim, M.D.              DATE OF BIRTH:  11-Mar-1981   DATE OF ADMISSION:  08/01/2002  DATE OF DISCHARGE:  08/04/2002                                 DISCHARGE SUMMARY   IDENTIFYING DATA:  This is a 30 year old single male, voluntarily admitted,  feeling  that he could not be safe, feared that he would hurt himself and  had anger outbursts and broke a door at his grandmother's house.   MEDICATIONS:  Trileptal 150 mg t.i.d.   ALLERGIES:  No known drug allergies.   PHYSICAL EXAMINATION:  Essentially within normal limits. Neurologically  nonfocal.   LABORATORY DATA:  Routine admission labs were within normal limits.   MENTAL STATUS EXAM:  An overweight, large built, cooperative  male,  reclusive, poor eye contact. Speech within normal limits. Mood irritable.  Affect restricted. Thought process is goal directed. Thought content  negative for psychotic symptoms or dangerous ideation. Cognitively intact.  Judgment and insight fair to poor.   ADMISSION DIAGNOSES:   AXIS I:  Mood disorder.   AXIS II:  None.   AXIS III:  None.   AXIS IV:  Moderate conflict with family and support system.   AXIS V:  38/70.   HOSPITAL COURSE:  The patient was admitted where and we ordered routine  p.r.n.  medications. He underwent further management. He was  recommended  for anger management. He seemed to benefit from  therapy and medication  changes. The patient was continued on Trileptal and Trileptal was optimized  to a more therapeutic dose. A family meeting to discuss aftercare planning  was completed and the patient's condition on discharge was markedly  improved.   Mood was more euthymic, affect brighter, thought process is goal directed,  thought content  negative for any dangerous ideation or psychotic symptoms.  The patient felt he could much better control his impulses and anger.   DISCHARGE MEDICATIONS:  Trileptal 300 mg t.i.d.   FOLLOW UP:  He was to follow up with [facility] and Dr. Lourdes Sledge on August 08, 2002, at 11 a.m.    DISCHARGE DIAGNOSES:   AXIS I:  Mood disorder.   AXIS II:  None.   AXIS III:  None.   AXIS IV:  Moderate conflict with family and support system.   AXIS VKallie Locks, M.D.    JEM/MEDQ  D:  08/15/2002  T:  08/15/2002  Job:  [id number]

## 2011-01-29 NOTE — H&P (Signed)
[name], [name] NO.:  [id number]   MEDICAL RECORD NO.:  [id number]                   PATIENT TYPE:  IPS   LOCATION:  0503                                 FACILITY:  [facility]   PHYSICIAN:  Geoffery Lyons, M.D.                   DATE OF BIRTH:  1981/01/03   DATE OF ADMISSION:  08/10/2002  DATE OF DISCHARGE:                         PSYCHIATRIC ADMISSION ASSESSMENT   IDENTIFYING INFORMATION:  This is a 30 year old white male, who is single,  voluntary admission.   HISTORY OF PRESENT ILLNESS:  This patient reports that he had a bad day over  the Thanksgiving holiday with several stressors, including conflict with his  great-grandmother and with his girlfriend.  One of relatives reported to the  nurses that he had been looking for a gun prior to overdosing on Tylenol,  which he states that he did at the end of the day because he just felt  unable to cope with his feelings of frustration after a day of conflict.  The patient reports that he did not take his medicines all day, although he  generally feels that they help him maintain some stability and focus.  He  endorses suicidal ideation today.  He denies any homicidal ideation or  auditory or visual hallucinations.   PAST PSYCHIATRIC HISTORY:  This is the fifth [place] inpatient admission within the past 30 days.  The  patient has a history of prior suicide attempts by overdose.   SOCIAL HISTORY:  The patient is an unemployed, 30 year old, white male who  is single.  He has a child who is approximately 49 years old by another  girlfriend who does not live with him.  He has a girlfriend who is currently  pregnant in her second trimester with his second child.  The patient  currently lives in his great-grandmother's home.  He has no history of legal  charges.   FAMILY HISTORY:  Unclear.   ALCOHOL AND DRUG HISTORY:  The patient is a nonsmoker.  He denies  any  history of substance abuse.   PAST MEDICAL HISTORY:  The patient has no regular primary care [facility].  He  denies any current medical problems, however, it is status post  acetaminophen overdose and it was treated in the emergency room.   MEDICATIONS:  Trileptal 300 mg p.o. t.i.d.  His compliance is questionable.   ALLERGIES:  No known drug allergies.   PHYSICAL EXAMINATION:  The patient's physical exam was done in the emergency  room and is documented in the medical record.  The patient is without any  nausea or vomiting today.  His vital signs are within normal limits.  He is  5 feet 7 inches tall and weighs 220 pounds.   LABORATORY DATA:  The patient's acetaminophen level was originally 53.9 in  the emergency room.  His salicylate  level was less than 4.  The patient's  metabolic panel reveals electrolytes within normal limits.  His glucose was  mildly elevated at 134.  Initially his SGOT was elevated at 183 and SGPT at  128.  His CBC showed a mildly elevated white count of 12.5, hemoglobin 16.6,  and hematocrit 49.0.  He had mildly elevation of his neutrophils at 9600  absolute count.  The patient's alcohol level was less than 5.  The second  acetaminophen level done an hour and 15 minutes later showed that his  acetaminophen level had decreased to 30.3.   MENTAL STATUS EXAMINATION:  This is a well-nourished, well-developed, short,  heavily-built, white male who is fully alert.  He is in no acute distress.  He has a somewhat blunted irritable effect.  He is in full control of his  faculties.  He is composed with an appropriate affect, although it is a bit  irritable.  Speech is normal.  Mood is euthymic.  The patient's thought  content is dominated by his concerns that everyone else has caused him to be  in a bad mood and resulted in his being here.  He reports that he has been  feeling that he was doing well on the medications and then is angry because  family members at  home have had issues with his behavior and have placed  demands on him.  Thought process is logical.  He is in full control of  himself.  No evidence of homicidal ideation or auditory or visual  hallucinations.  No psychosis.  No homicidal ideation.  He is positive for  suicidal ideation without any particular plan.  Cognitively he is intact and  oriented x 3.  Intelligence is average.  Insight is poor.  Impulse control  poor.  Judgment questionable.   AXIS I:  Mood disorder not otherwise specified.   AXIS II:  Deferred.   AXIS III:  Status post acetaminophen overdose and elevated liver enzymes not  otherwise specified.   AXIS IV:  Deferred.   AXIS V:  Global Assessment of Functioning:  Current 30, past year 68.   PLAN:  Voluntarily admit the patient with 15-minute checks in place.  We are  going to ask the case manager to evaluate his family's concerns.  Meanwhile,  we will continue his Trileptal 300 mg p.o. t.i.d. for now and will do a  recheck on his liver panel along with pro time, partial thromboplastin time,  and check his INR in the wake of his Tylenol overdose.  The estimated length  of stay is five days.     Margaret A. Scott, N.P.                   Geoffery Lyons, M.D.    MAS/MEDQ  D:  08/11/2002  T:  08/11/2002  Job:  [id number]

## 2012-02-26 ENCOUNTER — Encounter (HOSPITAL_COMMUNITY): Payer: Self-pay

## 2012-02-26 ENCOUNTER — Emergency Department (HOSPITAL_COMMUNITY)
Admission: EM | Admit: 2012-02-26 | Discharge: 2012-02-26 | Disposition: A | Discharge status: Home / Self Care | Payer: Medicaid Other | Attending: Emergency Medicine | Admitting: Emergency Medicine

## 2012-02-26 DIAGNOSIS — Y838 Other surgical procedures as the cause of abnormal reaction of the patient, or of later complication, without mention of misadventure at the time of the procedure: Secondary | ICD-10-CM | POA: Insufficient documentation

## 2012-02-26 DIAGNOSIS — IMO0002 Reserved for concepts with insufficient information to code with codable children: Secondary | ICD-10-CM | POA: Insufficient documentation

## 2012-02-26 HISTORY — DX: Enterocolitis due to Clostridium difficile, not specified as recurrent: A04.72

## 2012-02-26 MED ORDER — BACITRACIN ZINC 500 UNIT/GM EX OINT
TOPICAL_OINTMENT | CUTANEOUS | Status: AC
  Filled 2012-02-26: qty 2.7

## 2012-02-26 MED ORDER — NAPROXEN 500 MG PO TABS: 500.0000 mg | ORAL_TABLET | Freq: Two times a day (BID) | ORAL | Status: DC

## 2012-02-26 MED ORDER — BACITRACIN 500 UNIT/GM EX OINT
1.0000 "application " | TOPICAL_OINTMENT | Freq: Two times a day (BID) | CUTANEOUS | Status: DC
  Filled 2012-02-26 (×4): qty 0.9

## 2012-02-26 MED ORDER — BACITRACIN ZINC 500 UNIT/GM EX OINT: TOPICAL_OINTMENT | Freq: Two times a day (BID) | CUTANEOUS | Status: AC

## 2012-02-26 NOTE — ED Notes (Signed)
Place where he had surgery several years ago did not heal right and tonight it bursted open and started squirting blood per s/o.

## 2012-02-26 NOTE — ED Provider Notes (Signed)
History     CSN: [id number]  Arrival date & time 02/26/12  0224   First MD Initiated Contact with Patient 02/26/12 0232      Chief Complaint  Patient presents with  . Wound Check    (Consider location/radiation/quality/duration/timing/severity/associated sxs/prior treatment) HPI Comments: 31 year old male status post appendectomy in the last couple of years. He presents with a recurrent bleeding wound in his suprapubic area of his abdomen. According to the patient and his family member this has had several episodes of draining bloody material over the last several months. It is mild, intermittent, and recurrent.  He states that over several days he will have an increased amount of pain that culminates in the rupture of this wound followed by a bloody nonpurulent non-foul odor smelling drainage. He denies spreading redness or tenderness around the wound and has no vomiting or fevers. He states this was a surgical site for his appendectomy and feels that it never healed right. He has not followed up with his general surgeon for this  Patient is a 31 y.o. male presenting with wound check. The history is provided by the patient and the spouse.  Wound Check     Past Medical History  Diagnosis Date  . C. difficile colitis     Past Surgical History  Procedure Date  . Appendectomy     History reviewed. No pertinent family history.  History  Substance Use Topics  . Smoking status: Never Smoker   . Smokeless tobacco: Not on file  . Alcohol Use: Yes      Review of Systems  Constitutional: Negative for fever.  Gastrointestinal: Negative for vomiting.  Skin: Positive for wound.    Allergies  Review of patient's allergies indicates no known allergies.  Home Medications   Current Outpatient Rx  Name Route Sig Dispense Refill  . BACITRACIN ZINC 500 UNIT/GM EX OINT Topical Apply topically 2 (two) times daily. 15 g 0  . NAPROXEN 500 MG PO TABS Oral Take 1 tablet (500 mg total)  by mouth 2 (two) times daily with a meal. 30 tablet 0    BP 133/75  Pulse 84  Temp 98 F (36.7 C) (Oral)  Resp 16  Ht 5\' 5"  (1.651 m)  Wt 225 lb (102.059 kg)  BMI 37.44 kg/m2  SpO2 96%  Physical Exam  Nursing note and vitals reviewed. Constitutional: He appears well-developed and well-nourished.  HENT:  Head: Normocephalic and atraumatic.  Eyes: Conjunctivae are normal. No scleral icterus.  Pulmonary/Chest: Effort normal and breath sounds normal.  Abdominal: Soft. There is no tenderness.       Abdomen is soft and nontender, in the suprapubic area there is a small draining wound, see skin section  Musculoskeletal: Normal range of motion. He exhibits no edema and no tenderness.  Skin:       0.5 cm draining seroma in the suprapubic area, no surrounding induration tenderness fluctuance. Material or foul odor.    ED Course  Procedures (including critical care time)  Labs Reviewed - No data to display No results found.   1. Seroma       MDM  Overall the patient appears well appearing, vital signs are normal and he has a focal what appears to be draining seroma in the suprapubic area. I am unable to express any further fluid from this open wound, I have asked the nurse to apply bacitracin and a sterile dressing and have instructed the patient to do the same for the next week. Prescriptions  given for Naprosyn, bacitracin, warm soaks in the followup list of family doctors in the community. 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 At this time I do not feel that oral antibiotics are needed for what does not appear to be a cellulitis or an abscess.  Discharge Prescriptions include:  Naprosyn Bacitracin         Vida Roller, MD 02/26/12 [phone]

## 2012-02-26 NOTE — Discharge Instructions (Signed)
The bleeding has likely occurred because of a nonhealing seroma. This is a collection of sterile fluid and blood that occurs underneath the skin after surgery. This usually goes away by itself especially if it is draining spontaneously. Please use the topical antibiotic cream as prescribed, take warm soaks in the bathtub twice a day and followup with a family Dr. If you do not have one, see the list below. It would probably be beneficial to followup with her general surgeon in the next 2 weeks if this does not heal.  RESOURCE GUIDE  Chronic Pain Problems: Contact [facility]  [phone] Patients need to be referred by their primary care doctor.  Insufficient Money for Medicine: Contact United Way:  call "211" or [facility] [phone].  No Primary Care Doctor: - Call [facility]  [phone] - can help you locate a primary care doctor that  accepts your insurance, provides certain services, etc. - Physician Referral Service- [phone]  Agencies that provide inexpensive medical care: - [facility]  [phone] - [facility]  [phone] - [facility]  [phone] - Women's Clinic  [phone] - Planned Parenthood  [phone] [facility]  [phone]  Medicaid-accepting [facility] Providers: - [facility]- [address]  [phone], Mon-Fri 9am-7pm, Sat 9am-1pm - [facility]- [address] [place], [address] [place]  [phone] - [facility]- [address] [place]  [phone] [facility]- [address]  [phone] - Renaye Rakers- [address] [place], [address], [phone]  Only accepts [place] patients after they have their name  applied to their card  Self Pay (no insurance) in [place]: - Sickle Cell Patients: Dr Willey Blade, [facility]  [address] [place], [phone] - [facility]- [address] [place]  [phone]       [facility]- [address]       -     [facility]- see information above (Speak to [employer] if you do not have insurance)       -  [facility]- [address] [place], [phone]       -  [facility]- [address],  [phone]       -  [facility]- [address], [phone]       -  Dr Julio Sicks-  [address], [place], [phone]       -  [facility]- [address], [phone]       -  [facility]- [address] [phone], also [address], [phone]       -    [facility]- [address] [place], [phone], 1st & 3rd Saturday   every month, 10am-1pm  1) Find a Doctor and Pay Out of Pocket Although you won't have to find out who is covered by your insurance plan, it is a good idea to ask around and get recommendations. You will then need to call the office and see if the doctor you have chosen will accept you as a new patient and what types of options they offer for patients who are self-pay. Some doctors offer discounts or will set up payment plans for their  patients who do not have insurance, but you will need to ask so you aren't surprised when you get to your appointment.  2) Contact Your Local Health Department Not all health departments have doctors that can see patients for sick visits, but many do, so it is worth a call to see if yours does. If you don't know where your local health department is, you can check in your phone book. The CDC also has a tool to help you locate your state's health department, and many state websites also have listings of all of their local health departments.  3) Find a Walk-in Clinic If your illness is not likely to be very severe or complicated, you may want to try a walk in clinic. These are popping up all over the country in pharmacies, drugstores, and shopping  centers. They're usually staffed by nurse practitioners or physician assistants that have been trained to treat common illnesses and complaints. They're usually fairly quick and inexpensive. However, if you have serious medical issues or chronic medical problems, these are probably not your best option  STD Testing - [facility] Department of [facility], STD Clinic, [address], [place], phone [phone] or [phone].  Monday - Friday, call for an appointment. [facility] Department of [employer], STD Clinic, [address], [place], phone [phone] or [phone].  Monday - Friday, call for an appointment.  Abuse/Neglect: [facility] Child Abuse Hotline (248)885-5915 [facility] Child Abuse Hotline 615-470-5396 (After Hours)  Emergency Shelter:  [facility] 321-876-6653  Maternity Homes: - Room at the [facility] 315-870-2074 - [facility] ([phone]  MRSA Hotline #:   223-105-7335  [facility] Resources  [facility] [place]  United Way [facility] Dept. [address]                 [address]         [address] [place] [address]  Blondell Reveal Phone:  [phone]                                  Phone:  [phone]                   Phone:  [phone]  [facility], [phone] - [phone]       -     [facility] in [place], [address],                                  [phone], Insurance  [place] Child Abuse Hotline 820-190-4081 or 931-266-2756 (After Hours)   Behavioral Health Services  Substance Abuse Resources: - [facility] and Drug Services  773-578-6237 - [facility] ([phone] - [facility]  [phone] Floydene Flock [phone] - Residential & Outpatient Substance Abuse Program  [phone]  Psychological Services: [facility]  ([phone] Services  3104802249 - [facility], [phone] [place]. [address] [place], ACCESS LINE: [phone] or 7152222223, [employer]ur[employer]an.co.za  Dental Assistance  If unable to pay or uninsured, contact:  [facility] or [facility]. to become qualified for the adult dental clinic.  Patients with Medicaid: [facility] 4788160684 [address], [phone] [address], [phone]  If unable to pay, or uninsured, contact [facility] [phone]) or [facility] [phone] in [place], [phone] in [facility]) to become qualified for the adult dental clinic  Other Low-Cost Community Dental Services: - [facility]- [address] [place], [place], [place], [address], [phone], [address]. [address], 2nd and 4th Thursday of the month at 6:30am.  10 clients each day by appointment, can sometimes see walk-in patients if someone does not show for an appointment. [facility]- [address] [place], [place], [address], [phone] - [facility]- [address], [place], [place], [address], [phone] - [facility]- [phone] [facility] Department- [phone] [facility] Department- [phone]

## 2012-02-26 NOTE — ED Notes (Signed)
Education provided on wound care and on sx to return to MD if wound gets worse or pt starts having fevers

## 2012-02-26 NOTE — ED Notes (Signed)
MD at bedside. 

## 2012-02-26 NOTE — ED Notes (Addendum)
Wound low abd Drained per pt and spouse; Area tender

## 2012-09-02 ENCOUNTER — Encounter (HOSPITAL_COMMUNITY): Payer: Self-pay | Admitting: Emergency Medicine

## 2012-09-02 ENCOUNTER — Emergency Department (HOSPITAL_COMMUNITY)
Admission: EM | Admit: 2012-09-02 | Discharge: 2012-09-02 | Disposition: A | Discharge status: Home / Self Care | Payer: Medicaid Other | Attending: Emergency Medicine | Admitting: Emergency Medicine

## 2012-09-02 DIAGNOSIS — R202 Paresthesia of skin: Secondary | ICD-10-CM

## 2012-09-02 DIAGNOSIS — Z9889 Other specified postprocedural states: Secondary | ICD-10-CM | POA: Insufficient documentation

## 2012-09-02 DIAGNOSIS — F17200 Nicotine dependence, unspecified, uncomplicated: Secondary | ICD-10-CM | POA: Insufficient documentation

## 2012-09-02 DIAGNOSIS — Z8719 Personal history of other diseases of the digestive system: Secondary | ICD-10-CM | POA: Insufficient documentation

## 2012-09-02 DIAGNOSIS — R209 Unspecified disturbances of skin sensation: Secondary | ICD-10-CM | POA: Insufficient documentation

## 2012-09-02 MED ORDER — NAPROXEN 500 MG PO TABS: 500.0000 mg | ORAL_TABLET | Freq: Two times a day (BID) | ORAL | Status: DC

## 2012-09-02 NOTE — ED Notes (Signed)
Reports onset Monday of right arm/hand numbness/ tension from rotor cuff to hands. 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 Worse pain rated 8-9/10 constant 7/10 worse in AM. 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 This week noted reoccurring chest pain described as a cramp 8/10 duration 5-78min's. Denies any other accompanied symptoms.

## 2012-09-02 NOTE — ED Provider Notes (Signed)
Medical screening examination/treatment/procedure(s) were performed by non-physician practitioner and as supervising physician I was immediately available for consultation/collaboration.    Jammal Sarr R Pegi Milazzo, MD 09/02/12 1619 

## 2012-09-02 NOTE — ED Notes (Signed)
Pt presents right hand numbness onset since Monday, denies injury, worse when lying down. Pt states "tension" from rotator cuff to tips of fingers.

## 2012-09-02 NOTE — ED Provider Notes (Signed)
History     CSN: [id number]  Arrival date & time 09/02/12  0714   First MD Initiated Contact with Patient 09/02/12 [phone]      Chief Complaint  Patient presents with  . Hand Pain    (Consider location/radiation/quality/duration/timing/severity/associated sxs/prior treatment) HPI [name] is a 31 y.o. male who presents with complaint of numbness and tingling in right hand. States started about a week ago. It is constant now, states worse when laying down, better when walking around. No particular distribution, states "all over hand." States feels "tension" in arm shoulder down as well. Denies neck or shoulder pain or injury. Denies weakness in hands. States feeling sharp pain in his hand. 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 Denies hx of the same.  Not taking any medications for this.    Past Medical History  Diagnosis Date  . C. difficile colitis     Past Surgical History  Procedure Date  . Appendectomy   . Tonsillectomy     No family history on file.  History  Substance Use Topics  . Smoking status: Current Every Day Smoker -- .2 years  . Smokeless tobacco: Never Used  . Alcohol Use: No      Review of Systems  Constitutional: Negative for fever and chills.  HENT: Negative for neck pain and neck stiffness.   Respiratory: Negative.   Cardiovascular: Negative.   Musculoskeletal: Negative.   Skin: Negative.   Neurological: Positive for numbness. Negative for weakness.    Allergies  Review of patient's allergies indicates no known allergies.  Home Medications   Current Outpatient Rx  Name  Route  Sig  Dispense  Refill  . NAPROXEN 500 MG PO TABS   Oral   Take 1 tablet (500 mg total) by mouth 2 (two) times daily with a meal.   30 tablet   0     BP 133/82  Pulse 71  Temp 97.7 F (36.5 C) (Oral)  Resp 16  SpO2 97%  Physical Exam  Nursing note and vitals reviewed. Constitutional: He is oriented to person, place, and time.  He appears well-developed and well-nourished. No distress.  HENT:  Head: Normocephalic.  Eyes: Conjunctivae normal are normal.  Neck: Normal range of motion. Neck supple.       No midline tenderness over cervical spine  Cardiovascular: Normal rate, regular rhythm and normal heart sounds.   Pulmonary/Chest: Effort normal and breath sounds normal. No respiratory distress. He has no wheezes. He has no rales.  Musculoskeletal:       Full ROM of right  shoulder, elbow, wrist. Full rom of all fingers of right hand. PT able to spread all fingers, do "thumbs up," make a fist. Grip strength 5/5 and equal bilaterally. Normal sensation over all fingers, normal two point discrimination over all finger tips. Normal distal radial pulses bilaterally  Neurological: He is alert and oriented to person, place, and time.  Skin: Skin is warm and dry.  Psychiatric: He has a normal mood and affect. His behavior is normal.    ED Course  Procedures (including critical care time)   Date: 09/02/2012  Rate: 73  Rhythm: normal sinus rhythm  QRS Axis: normal  Intervals: normal  ST/T Wave abnormalities: normal  Conduction Disutrbances: none  Narrative Interpretation:   Old EKG Reviewed: No old     1. Paresthesia of hand       MDM  Pt with right hand numbness and tingling. Normal exam today.  i suspect his symptoms are due to either cervical or brachial plexus injury/inflammation. He does do a lot of physical work on his job. I will start him on NSAIDs, follow up with orthopedics. He will need further testing if symptoms continue. This discussed with pt, he agrees with the plan. Pt denied any chest pain to me, states some discomfort, that he thinks were cramps on and off over last week. Sound very atypical. ECG normal. VS normal.    Filed Vitals:   09/02/12 0723  BP: 133/82  Pulse: 71  Temp: 97.7 F (36.5 C)  Resp: [address] Shevawn Langenberg, PA 09/02/12 0843  Lottie Mussel,  PA 09/02/12 [phone]

## 2013-10-07 ENCOUNTER — Emergency Department (HOSPITAL_COMMUNITY)
Admission: EM | Admit: 2013-10-07 | Discharge: 2013-10-07 | Disposition: A | Discharge status: Home / Self Care | Payer: No Typology Code available for payment source | Attending: Emergency Medicine | Admitting: Emergency Medicine

## 2013-10-07 ENCOUNTER — Encounter (HOSPITAL_COMMUNITY): Payer: Self-pay | Admitting: Emergency Medicine

## 2013-10-07 ENCOUNTER — Emergency Department (HOSPITAL_COMMUNITY): Payer: No Typology Code available for payment source

## 2013-10-07 DIAGNOSIS — S01512A Laceration without foreign body of oral cavity, initial encounter: Secondary | ICD-10-CM

## 2013-10-07 DIAGNOSIS — Y92410 Unspecified street and highway as the place of occurrence of the external cause: Secondary | ICD-10-CM | POA: Insufficient documentation

## 2013-10-07 DIAGNOSIS — Y9389 Activity, other specified: Secondary | ICD-10-CM | POA: Insufficient documentation

## 2013-10-07 DIAGNOSIS — S0990XA Unspecified injury of head, initial encounter: Secondary | ICD-10-CM | POA: Insufficient documentation

## 2013-10-07 DIAGNOSIS — S01501A Unspecified open wound of lip, initial encounter: Secondary | ICD-10-CM | POA: Insufficient documentation

## 2013-10-07 DIAGNOSIS — S161XXA Strain of muscle, fascia and tendon at neck level, initial encounter: Secondary | ICD-10-CM

## 2013-10-07 DIAGNOSIS — Z23 Encounter for immunization: Secondary | ICD-10-CM | POA: Insufficient documentation

## 2013-10-07 DIAGNOSIS — Z8619 Personal history of other infectious and parasitic diseases: Secondary | ICD-10-CM | POA: Insufficient documentation

## 2013-10-07 DIAGNOSIS — Z87891 Personal history of nicotine dependence: Secondary | ICD-10-CM | POA: Insufficient documentation

## 2013-10-07 DIAGNOSIS — S139XXA Sprain of joints and ligaments of unspecified parts of neck, initial encounter: Secondary | ICD-10-CM | POA: Insufficient documentation

## 2013-10-07 MED ORDER — OXYCODONE-ACETAMINOPHEN 5-325 MG PO TABS: 1.0000 | ORAL_TABLET | ORAL | Status: DC | PRN

## 2013-10-07 MED ORDER — AMOXICILLIN 500 MG PO CAPS
1000.0000 mg | ORAL_CAPSULE | Freq: Once | ORAL | Status: AC
  Administered 2013-10-07: 1000 mg via ORAL
  Filled 2013-10-07: qty 2

## 2013-10-07 MED ORDER — IBUPROFEN 800 MG PO TABS
800.0000 mg | ORAL_TABLET | Freq: Once | ORAL | Status: AC
  Administered 2013-10-07: 800 mg via ORAL
  Filled 2013-10-07: qty 1

## 2013-10-07 MED ORDER — TETANUS-DIPHTH-ACELL PERTUSSIS 5-2.5-18.5 LF-MCG/0.5 IM SUSP
0.5000 mL | Freq: Once | INTRAMUSCULAR | Status: AC
  Administered 2013-10-07: 0.5 mL via INTRAMUSCULAR
  Filled 2013-10-07: qty 0.5

## 2013-10-07 MED ORDER — AMOXICILLIN 500 MG PO TABS: 1000.0000 mg | ORAL_TABLET | Freq: Two times a day (BID) | ORAL | Status: DC

## 2013-10-07 NOTE — ED Provider Notes (Signed)
Medical screening examination/treatment/procedure(s) were conducted as a shared visit with non-physician practitioner(s) and myself.  I personally performed the initial history and physical. Nonphysician practitioner was only involved in the care to do repair of intraoral laceration.   Dione Boozeavid Leo Weyandt, MD 10/07/13 [phone]
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
## 2013-10-07 NOTE — ED Notes (Signed)
Pt given d/c instructions and verbalized understanding. NAD at this time.  
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
## 2013-10-07 NOTE — ED Notes (Signed)
Pt transported to CT ?
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
## 2013-12-20 ENCOUNTER — Emergency Department (INDEPENDENT_AMBULATORY_CARE_PROVIDER_SITE_OTHER)
Admission: EM | Admit: 2013-12-20 | Discharge: 2013-12-20 | Disposition: A | Discharge status: Home / Self Care | Payer: Self-pay | Source: Home / Self Care | Attending: Family Medicine | Admitting: Family Medicine

## 2013-12-20 ENCOUNTER — Encounter (HOSPITAL_COMMUNITY): Payer: Self-pay | Admitting: Emergency Medicine

## 2013-12-20 DIAGNOSIS — F411 Generalized anxiety disorder: Secondary | ICD-10-CM

## 2013-12-20 DIAGNOSIS — F419 Anxiety disorder, unspecified: Secondary | ICD-10-CM

## 2013-12-20 DIAGNOSIS — K219 Gastro-esophageal reflux disease without esophagitis: Secondary | ICD-10-CM

## 2013-12-20 MED ORDER — CLONAZEPAM 0.5 MG PO TABS: 0.5000 mg | ORAL_TABLET | Freq: Two times a day (BID) | ORAL | Status: DC | PRN

## 2013-12-20 MED ORDER — RANITIDINE HCL 150 MG PO CAPS: 150.0000 mg | ORAL_CAPSULE | Freq: Two times a day (BID) | ORAL | Status: DC

## 2013-12-20 NOTE — Discharge Instructions (Signed)
Follow up with a regular doctor if your symptoms continue.  If you feel your symptoms improve with the clonazepam, talk with your doctor about this as well for further treatment.    Gastroesophageal Reflux Disease, Adult Gastroesophageal reflux disease (GERD) happens when acid from your stomach goes into your food pipe (esophagus). The acid can cause a burning feeling in your chest. Over time, the acid can make small holes (ulcers) in your food pipe.  HOME CARE  Ask your doctor for advice about:  Losing weight.  Quitting smoking.  Alcohol use.  Avoid foods and drinks that make your problems worse. You may want to avoid:  Caffeine and alcohol.  Chocolate.  Mints.  Garlic and onions.  Spicy foods.  Citrus fruits, such as oranges, lemons, or limes.  Foods that contain tomato, such as sauce, chili, salsa, and pizza.  Fried and fatty foods.  Avoid lying down for 3 hours before you go to bed or before you take a nap.  Eat small meals often, instead of large meals.  Wear loose-fitting clothing. Do not wear anything tight around your waist.  Raise (elevate) the head of your bed 6 to 8 inches with wood blocks. Using extra pillows does not help.  Only take medicines as told by your doctor.  Do not take aspirin or ibuprofen. GET HELP RIGHT AWAY IF:   You have pain in your arms, neck, jaw, teeth, or back.  Your pain gets worse or changes.  You feel sick to your stomach (nauseous), throw up (vomit), or sweat (diaphoresis).  You feel short of breath, or you pass out (faint).  Your throw up is green, yellow, black, or looks like coffee grounds or blood.  Your poop (stool) is red, bloody, or black. MAKE SURE YOU:   Understand these instructions.  Will watch your condition.  Will get help right away if you are not doing well or get worse. Document Released: 02/16/2008 Document Revised: 11/22/2011 Document Reviewed: 03/19/2011 [facility] Patient Information 2014  [place]Care, [place].

## 2013-12-20 NOTE — ED Notes (Signed)
Patient reports a 3 week history of feeling a lump in his throat, onset 3 weeks ago.  Feels lump in throat with eating and drinking, feels like choking sensation when lying down.  Intermittently feels arms/legs numb and tingling and chest pain.  No pain currently.

## 2013-12-21 ENCOUNTER — Encounter (HOSPITAL_COMMUNITY): Payer: Self-pay | Admitting: Emergency Medicine
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
## 2013-12-23 NOTE — ED Provider Notes (Signed)
Medical screening examination/treatment/procedure(s) were performed by resident physician or non-physician practitioner and as supervising physician I was immediately available for consultation/collaboration.   Cathe Bilger DOUGLAS MD.   Adellyn Capek D Lasheena Frieze, MD 12/23/13 1020 

## 2016-02-09 ENCOUNTER — Ambulatory Visit (INDEPENDENT_AMBULATORY_CARE_PROVIDER_SITE_OTHER): Payer: Self-pay

## 2016-02-09 ENCOUNTER — Encounter (HOSPITAL_COMMUNITY): Payer: Self-pay | Admitting: Emergency Medicine

## 2016-02-09 ENCOUNTER — Ambulatory Visit (HOSPITAL_COMMUNITY)
Admission: EM | Admit: 2016-02-09 | Discharge: 2016-02-09 | Disposition: A | Discharge status: Home / Self Care | Payer: Self-pay | Attending: Family Medicine | Admitting: Family Medicine

## 2016-02-09 DIAGNOSIS — S61432A Puncture wound without foreign body of left hand, initial encounter: Secondary | ICD-10-CM

## 2016-02-09 MED ORDER — HYDROCODONE-ACETAMINOPHEN 5-325 MG PO TABS: 1.0000 | ORAL_TABLET | ORAL | Status: DC | PRN

## 2016-02-09 MED ORDER — CEPHALEXIN 500 MG PO CAPS: 500.0000 mg | ORAL_CAPSULE | Freq: Four times a day (QID) | ORAL | Status: DC

## 2016-02-09 NOTE — ED Provider Notes (Signed)
CSN: [id number]     Arrival date & time 02/09/16  1307 History   First MD Initiated Contact with Patient 02/09/16 1434     Chief Complaint  Patient presents with  . Puncture Wound   (Consider location/radiation/quality/duration/timing/severity/associated sxs/prior Treatment) HPI History obtained from patient:  Pt presents with the cc of:  Puncture wound left hand Duration of symptoms: About 1 hour ago Treatment prior to arrival: Bandage applied at home Context: Drilled into the palm of his hand quarter inch drill bit. Other symptoms include: Just pain Pain score: 4 FAMILY HISTORY: No significant family history.    Past Medical History  Diagnosis Date  . C. difficile colitis    Past Surgical History  Procedure Laterality Date  . Appendectomy    . Tonsillectomy     History reviewed. No pertinent family history. Social History  Substance Use Topics  . Smoking status: Former Smoker -- .2 years  . Smokeless tobacco: Never Used  . Alcohol Use: No    Review of Systems  Denies: HEADACHE, NAUSEA, ABDOMINAL PAIN, CHEST PAIN, CONGESTION, DYSURIA, SHORTNESS OF BREATH  Allergies  Review of patient's allergies indicates no known allergies.  Home Medications   Prior to Admission medications   Medication Sig Start Date End Date Taking? Authorizing Provider  amoxicillin (AMOXIL) 500 MG tablet Take 2 tablets (1,000 mg total) by mouth 2 (two) times daily. 10/07/13   Dione Booze, MD  clonazePAM (KLONOPIN) 0.5 MG tablet Take 1 tablet (0.5 mg total) by mouth 2 (two) times daily as needed for anxiety. 12/20/13   Cathlyn Parsons, NP  oxyCODONE-acetaminophen (PERCOCET) 5-325 MG per tablet Take 1 tablet by mouth every 4 (four) hours as needed. 10/07/13   Dione Booze, MD  ranitidine (ZANTAC) 150 MG capsule Take 1 capsule (150 mg total) by mouth 2 (two) times daily. 12/20/13   Cathlyn Parsons, NP   Meds Ordered and Administered this Visit  Medications - No data to display  BP 130/78 mmHg  Pulse  104  Temp(Src) 98.6 F (37 C) (Oral)  Resp 16  SpO2 97% No data found.   Physical Exam NURSES NOTES AND VITAL SIGNS REVIEWED. CONSTITUTIONAL: Well developed, well nourished, no acute distress HEENT: normocephalic, atraumatic EYES: Conjunctiva normal NECK:normal ROM, supple, no adenopathy PULMONARY:No respiratory distress, normal effort ABDOMINAL: Soft, ND, NT BS+, No CVAT MUSCULOSKELETAL: Normal ROM of all extremities, palm of left hand has a puncture wound noted between the second and third metatarsals. There is no bony tenderness. SKIN: warm and dry without rash PSYCHIATRIC: Mood and affect, behavior are normal  ED Course  Procedures (including critical care time)  Labs Review Labs Reviewed - No data to display  Imaging Review Dg Hand Complete Left  02/09/2016  CLINICAL DATA:  Drill injury. Wound at the base the index and middle fingers. EXAM: LEFT HAND - COMPLETE 3+ VIEW COMPARISON:  None. FINDINGS: No acute fracture or dislocation. No radiopaque foreign object. Soft tissue swelling about the level of the metacarpal phalangeal joints. Overlap of fingers on the lateral view. IMPRESSION: Soft tissue swelling, without acute osseous injury. Electronically Signed   By: Jeronimo Greaves M.D.   On: 02/09/2016 15:03   I have personally reviewed the x-ray. I discussed results of the x-ray with the patient.  Visual Acuity Review  Right Eye Distance:   Left Eye Distance:   Bilateral Distance:    Right Eye Near:   Left Eye Near:    Bilateral Near:      Prescription  for cephalexin and hydrocodone  Tetanus booster 2 years ago.  MDM   1. Puncture wound of left hand, initial encounter     Patient is reassured that there are no issues that require transfer to higher level of care at this time or additional tests. Patient is advised to continue home symptomatic treatment. Patient is advised that if there are new or worsening symptoms to attend the emergency department, contact  primary care provider, or return to [facility]. Instructions of care provided discharged home in stable condition.    THIS NOTE WAS GENERATED USING A VOICE RECOGNITION SOFTWARE PROGRAM. ALL REASONABLE EFFORTS  WERE MADE TO PROOFREAD THIS DOCUMENT FOR ACCURACY.  I have verbally reviewed the discharge instructions with the patient. A printed AVS was given to the patient.  All questions were answered prior to discharge.      Tharon AquasFrank C Patrick, PA 02/09/16 1737

## 2016-02-09 NOTE — ED Notes (Signed)
Patient c/o puncture wound on left hand about an hour and a half ago with a power drill. Patient denies numbness but is having bleeding and pressure. Patient is in NAD. Last tetanus in 2015.

## 2016-02-09 NOTE — Discharge Instructions (Signed)
Puncture Wound A puncture wound is an injury that is caused by a sharp, thin object that goes through (penetrates) your skin. Usually, a puncture wound does not leave a large opening in your skin, so it may not bleed a lot. However, when you get a puncture wound, dirt or other materials (foreign bodies) can be forced into your wound and break off inside. This increases the chance of infection, such as tetanus. CAUSES Puncture wounds are caused by any sharp, thin object that goes through your skin, such as:  Animal teeth, as with an animal bite.  Sharp, pointed objects, such as nails, splinters of glass, fishhooks, and needles. SYMPTOMS Symptoms of a puncture wound include:  Pain.  Bleeding.  Swelling.  Bruising.  Fluid leaking from the wound.  Numbness, tingling, or loss of function. DIAGNOSIS This condition is diagnosed with a medical history and physical exam. Your wound will be checked to see if it contains any foreign bodies. You may also have X-rays or other imaging tests. TREATMENT Treatment for a puncture wound depends on how serious the wound is. It also depends on whether the wound contains any foreign bodies. Treatment for all types of puncture wounds usually starts with:  Controlling the bleeding.  Washing out the wound with a germ-free (sterile) salt-water solution.  Checking the wound for foreign bodies. Treatment may also include:  Having the wound opened surgically to remove a foreign object.  Closing the wound with stitches (sutures) if it continues to bleed.  Covering the wound with antibiotic ointments and a bandage (dressing).  Receiving a tetanus shot.  Receiving a rabies vaccine. HOME CARE INSTRUCTIONS Medicines  Take or apply over-the-counter and prescription medicines only as told by your health care provider.  If you were prescribed an antibiotic, take or apply it as told by your health care provider. Do not stop using the antibiotic even if  your condition improves. Wound Care  There are many ways to close and cover a wound. For example, a wound can be covered with sutures, skin glue, or adhesive strips. Follow instructions from your health care provider about:  How to take care of your wound.  When and how you should change your dressing.  When you should remove your dressing.  Removing whatever was used to close your wound.  Keep the dressing dry as told by your health care provider. Do not take baths, swim, use a hot tub, or do anything that would put your wound underwater until your health care provider approves.  Clean the wound as told by your health care provider.  Do not scratch or pick at the wound.  Check your wound every day for signs of infection. Watch for:  Redness, swelling, or pain.  Fluid, blood, or pus. General Instructions  Raise (elevate) the injured area above the level of your heart while you are sitting or lying down.  If your puncture wound is in your foot, ask your health care provider if you need to avoid putting weight on your foot and for how long.  Keep all follow-up visits as told by your health care provider. This is important. SEEK MEDICAL CARE IF:  You received a tetanus shot and you have swelling, severe pain, redness, or bleeding at the injection site.  You have a fever.  Your sutures come out.  You notice a bad smell coming from your wound or your dressing.  You notice something coming out of your wound, such as wood or glass.  Your   pain is not controlled with medicine.  You have increased redness, swelling, or pain at the site of your wound.  You have fluid, blood, or pus coming from your wound.  You notice a change in the color of your skin near your wound.  You need to change the dressing frequently due to fluid, blood, or pus draining from your wound.  You develop a new rash.  You develop numbness around your wound. SEEK IMMEDIATE MEDICAL CARE IF:  You  develop severe swelling around your wound.  Your pain suddenly increases and is severe.  You develop painful skin lumps.  You have a red streak going away from your wound.  The wound is on your hand or foot and you cannot properly move a finger or toe.  The wound is on your hand or foot and you notice that your fingers or toes look pale or bluish.   This information is not intended to replace advice given to you by your health care provider. Make sure you discuss any questions you have with your health care provider.   Document Released: 06/09/2005 Document Revised: 05/21/2015 Document Reviewed: 10/23/2014 Elsevier Interactive Patient Education 2016 [employer]  

## 2017-04-09 DIAGNOSIS — K5792 Diverticulitis of intestine, part unspecified, without perforation or abscess without bleeding: Secondary | ICD-10-CM | POA: Insufficient documentation

## 2017-06-22 DIAGNOSIS — M545 Low back pain, unspecified: Secondary | ICD-10-CM | POA: Insufficient documentation

## 2017-08-27 DIAGNOSIS — M7552 Bursitis of left shoulder: Secondary | ICD-10-CM | POA: Insufficient documentation

## 2018-02-08 DIAGNOSIS — M7582 Other shoulder lesions, left shoulder: Secondary | ICD-10-CM | POA: Insufficient documentation

## 2018-03-08 DIAGNOSIS — M75122 Complete rotator cuff tear or rupture of left shoulder, not specified as traumatic: Secondary | ICD-10-CM | POA: Insufficient documentation

## 2018-03-13 DIAGNOSIS — M7542 Impingement syndrome of left shoulder: Secondary | ICD-10-CM | POA: Insufficient documentation

## 2018-03-28 DIAGNOSIS — Z9889 Other specified postprocedural states: Secondary | ICD-10-CM | POA: Insufficient documentation

## 2020-01-13 ENCOUNTER — Emergency Department (HOSPITAL_COMMUNITY): Payer: Self-pay

## 2020-01-13 ENCOUNTER — Emergency Department (HOSPITAL_COMMUNITY)
Admission: EM | Admit: 2020-01-13 | Discharge: 2020-01-13 | Disposition: A | Discharge status: Home / Self Care | Payer: Self-pay | Attending: Emergency Medicine | Admitting: Emergency Medicine

## 2020-01-13 ENCOUNTER — Encounter (HOSPITAL_COMMUNITY): Payer: Self-pay | Admitting: *Deleted

## 2020-01-13 ENCOUNTER — Other Ambulatory Visit: Payer: Self-pay

## 2020-01-13 DIAGNOSIS — R1032 Left lower quadrant pain: Secondary | ICD-10-CM | POA: Insufficient documentation

## 2020-01-13 DIAGNOSIS — Z87891 Personal history of nicotine dependence: Secondary | ICD-10-CM | POA: Insufficient documentation

## 2020-01-13 DIAGNOSIS — Z79899 Other long term (current) drug therapy: Secondary | ICD-10-CM | POA: Insufficient documentation

## 2020-01-13 HISTORY — DX: Diverticulitis of intestine, part unspecified, without perforation or abscess without bleeding: K57.92

## 2020-01-13 LAB — URINALYSIS, ROUTINE W REFLEX MICROSCOPIC
Bacteria, UA: NONE SEEN
Bilirubin Urine: NEGATIVE
Glucose, UA: NEGATIVE mg/dL
Ketones, ur: NEGATIVE mg/dL
Leukocytes,Ua: NEGATIVE
Nitrite: NEGATIVE
Protein, ur: NEGATIVE mg/dL
Specific Gravity, Urine: 1.026 (ref 1.005–1.030)
pH: 5 (ref 5.0–8.0)

## 2020-01-13 LAB — COMPREHENSIVE METABOLIC PANEL
ALT: 30 U/L (ref 0–44)
AST: 19 U/L (ref 15–41)
Albumin: 4.4 g/dL (ref 3.5–5.0)
Alkaline Phosphatase: 74 U/L (ref 38–126)
Anion gap: 8 (ref 5–15)
BUN: 17 mg/dL (ref 6–20)
CO2: 24 mmol/L (ref 22–32)
Calcium: 8.6 mg/dL — ABNORMAL LOW (ref 8.9–10.3)
Chloride: 107 mmol/L (ref 98–111)
Creatinine, Ser: 0.98 mg/dL (ref 0.61–1.24)
GFR calc Af Amer: >60 mL/min (ref >60)
GFR calc non Af Amer: >60 mL/min (ref >60)
Glucose, Bld: 103 mg/dL — ABNORMAL HIGH (ref 70–99)
Potassium: 3.9 mmol/L (ref 3.5–5.1)
Sodium: 139 mmol/L (ref 135–145)
Total Bilirubin: 0.7 mg/dL (ref 0.3–1.2)
Total Protein: 7.4 g/dL (ref 6.5–8.1)

## 2020-01-13 LAB — CBC WITH DIFFERENTIAL/PLATELET
Abs Immature Granulocytes: 0.03 10*3/uL (ref 0.00–0.07)
Basophils Absolute: 0.1 10*3/uL (ref 0.0–0.1)
Basophils Relative: 1 %
Eosinophils Absolute: 0.8 10*3/uL — ABNORMAL HIGH (ref 0.0–0.5)
Eosinophils Relative: 7 %
HCT: 51.9 % (ref 39.0–52.0)
Hemoglobin: 17.1 g/dL — ABNORMAL HIGH (ref 13.0–17.0)
Immature Granulocytes: 0 %
Lymphocytes Relative: 30 %
Lymphs Abs: 3.2 10*3/uL (ref 0.7–4.0)
MCH: 30.6 pg (ref 26.0–34.0)
MCHC: 32.9 g/dL (ref 30.0–36.0)
MCV: 93 fL (ref 80.0–100.0)
Monocytes Absolute: 0.7 10*3/uL (ref 0.1–1.0)
Monocytes Relative: 6 %
Neutro Abs: 5.7 10*3/uL (ref 1.7–7.7)
Neutrophils Relative %: 56 %
Platelets: 311 10*3/uL (ref 150–400)
RBC: 5.58 MIL/uL (ref 4.22–5.81)
RDW: 13.4 % (ref 11.5–15.5)
WBC: 10.4 10*3/uL (ref 4.0–10.5)
nRBC: 0 % (ref 0.0–0.2)

## 2020-01-13 LAB — LIPASE, BLOOD: Lipase: 30 U/L (ref 11–51)

## 2020-01-13 MED ORDER — ONDANSETRON HCL 4 MG/2ML IJ SOLN
4.0000 mg | Freq: Once | INTRAMUSCULAR | Status: AC
  Administered 2020-01-13: 4 mg via INTRAVENOUS
  Filled 2020-01-13: qty 2

## 2020-01-13 MED ORDER — MORPHINE SULFATE (PF) 2 MG/ML IV SOLN
2.0000 mg | Freq: Once | INTRAVENOUS | Status: AC
  Administered 2020-01-13: 21:00:00 2 mg via INTRAVENOUS
  Filled 2020-01-13: qty 1

## 2020-01-13 MED ORDER — SODIUM CHLORIDE 0.9% FLUSH
3.0000 mL | Freq: Once | INTRAVENOUS | Status: AC
  Administered 2020-01-13: 3 mL via INTRAVENOUS

## 2020-01-13 MED ORDER — SODIUM CHLORIDE 0.9 % IV BOLUS
1000.0000 mL | Freq: Once | INTRAVENOUS | Status: AC
  Administered 2020-01-13: 1000 mL via INTRAVENOUS

## 2020-01-13 MED ORDER — IOHEXOL 300 MG/ML  SOLN
100.0000 mL | Freq: Once | INTRAMUSCULAR | Status: AC | PRN
  Administered 2020-01-13: 20:00:00 100 mL via INTRAVENOUS

## 2020-01-13 NOTE — Discharge Instructions (Addendum)
Your comprehensive work-up today was reassuring.  Please drink plenty of fluids and continue to monitor your symptoms.  Please take your Zofran ODT as needed for symptoms of nausea.  It is vitally important that you get established with a primary care provider as soon as possible for ongoing evaluation management of your health and wellbeing.    Return to the ED or seek immediate medical attention should you experience any new or worsening symptoms.

## 2020-01-13 NOTE — ED Triage Notes (Signed)
Pt with abd pain that radiates up left chest pain ongoing for couple days ago, worse today.  + nausea, denies emesis or diarrhea.

## 2020-01-13 NOTE — ED Notes (Addendum)
To CT  Urine spec to lab

## 2020-01-13 NOTE — ED Provider Notes (Signed)
[facility] EMERGENCY DEPARTMENT Provider Note   CSN: [id number] Arrival date & time: 01/13/20  1757     History Chief Complaint  Patient presents with  . Abdominal Pain    [name] is a 39 y.o. male with past medical history significant for diverticulitis, C. difficile colitis, and reported appendectomy who presents to the ED with a 5-day history of progressively worsening LLQ abdominal pain.  Patient reports that he had diverticulitis with perforation and has been followed by gastroenterologist in [place], [place] subsequently.  His LLQ abdominal pain is 7 out of 10 and feels consistent with his prior diverticulitis.  He also has radiation of pain into the left side of his chest with associated shortness of breath symptoms.  He also endorses nausea and diminished appetite.  He has not received his COVID-19 vaccines.  He denies any fevers or chills, exertional chest pain, cough or difficulty breathing, vomiting, urinary symptoms, or changes in bowel habits.  HPI     Past Medical History:  Diagnosis Date  . C. difficile colitis   . Diverticulitis     There are no problems to display for this patient.   Past Surgical History:  Procedure Laterality Date  . APPENDECTOMY    . TONSILLECTOMY         History reviewed. No pertinent family history.  Social History   Tobacco Use  . Smoking status: Former Smoker    Years: 0.20  . Smokeless tobacco: Never Used  Substance Use Topics  . Alcohol use: No  . Drug use: No    Home Medications Prior to Admission medications   Medication Sig Start Date End Date Taking? Authorizing Provider  amoxicillin (AMOXIL) 500 MG tablet Take 2 tablets (1,000 mg total) by mouth 2 (two) times daily. 1/96/22   Delora Fuel, MD  cephALEXin (KEFLEX) 500 MG capsule Take 1 capsule (500 mg total) by mouth 4 (four) times daily. 02/09/16   Konrad Felix, PA  clonazePAM (KLONOPIN) 0.5 MG tablet Take 1 tablet (0.5 mg total) by mouth 2 (two) times  daily as needed for anxiety. 12/20/13   Carvel Getting, NP  HYDROcodone-acetaminophen (NORCO/VICODIN) 5-325 MG tablet Take 1-2 tablets by mouth every 4 (four) hours as needed. 02/09/16   Konrad Felix, PA  oxyCODONE-acetaminophen (PERCOCET) 5-325 MG per tablet Take 1 tablet by mouth every 4 (four) hours as needed. 2/97/98   Delora Fuel, MD  ranitidine (ZANTAC) 150 MG capsule Take 1 capsule (150 mg total) by mouth 2 (two) times daily. 12/20/13   Carvel Getting, NP    Allergies    Patient has no known allergies.  Review of Systems   Review of Systems  All other systems reviewed and are negative.   Physical Exam Updated Vital Signs BP 130/80   Pulse 85   Temp 98.7 F (37.1 C) (Oral)   Resp (!) 24   Ht 5\' 6"  (1.676 m)   Wt 104.3 kg   SpO2 96%   BMI 37.12 kg/m   Physical Exam Vitals and nursing note reviewed. Exam conducted with a chaperone present.  Constitutional:      Appearance: Normal appearance.  HENT:     Head: Normocephalic and atraumatic.  Eyes:     General: No scleral icterus.    Conjunctiva/sclera: Conjunctivae normal.  Cardiovascular:     Rate and Rhythm: Normal rate and regular rhythm.     Pulses: Normal pulses.     Heart sounds: Normal heart sounds.  Pulmonary:  Effort: Pulmonary effort is normal. No respiratory distress.     Breath sounds: Normal breath sounds. No wheezing or rales.  Abdominal:     Comments: Soft, nondistended.  Focal TTP in LLQ.  No TTP elsewhere.  No guarding.  No overlying skin changes.  NABS.  Musculoskeletal:     Cervical back: Normal range of motion.     Right lower leg: No edema.     Left lower leg: No edema.  Skin:    General: Skin is dry.     Capillary Refill: Capillary refill takes less than 2 seconds.  Neurological:     Mental Status: He is alert and oriented to person, place, and time.     GCS: GCS eye subscore is 4. GCS verbal subscore is 5. GCS motor subscore is 6.  Psychiatric:        Mood and Affect: Mood normal.         Behavior: Behavior normal.        Thought Content: Thought content normal.     ED Results / Procedures / Treatments   Labs (all labs ordered are listed, but only abnormal results are displayed) Labs Reviewed  COMPREHENSIVE METABOLIC PANEL - Abnormal; Notable for the following components:      Result Value   Glucose, Bld 103 (*)    Calcium 8.6 (*)    All other components within normal limits  URINALYSIS, ROUTINE W REFLEX MICROSCOPIC - Abnormal; Notable for the following components:   Hgb urine dipstick SMALL (*)    All other components within normal limits  CBC WITH DIFFERENTIAL/PLATELET - Abnormal; Notable for the following components:   Hemoglobin 17.1 (*)    Eosinophils Absolute 0.8 (*)    All other components within normal limits  LIPASE, BLOOD    EKG None  Radiology CT ABDOMEN PELVIS W CONTRAST  Result Date: 01/13/2020 CLINICAL DATA:  Left sided abd pain that radiates up left chest pain ongoing for couple days ago, worse today. + nausea, denies emesis or diarrhea. EXAM: CT ABDOMEN AND PELVIS WITH CONTRAST TECHNIQUE: Multidetector CT imaging of the abdomen and pelvis was performed using the standard protocol following bolus administration of intravenous contrast. CONTRAST:  OMNIPAQUE IOHEXOL 300 MG/ML  SOLN COMPARISON:  None. FINDINGS: Lower chest: No acute abnormality. Hepatobiliary: No focal liver abnormality is seen. No gallstones, gallbladder wall thickening, or biliary dilatation. Pancreas: Unremarkable. No pancreatic ductal dilatation or surrounding inflammatory changes. Spleen: Normal in size without focal abnormality. Adrenals/Urinary Tract: Adrenal glands are unremarkable. Kidneys are normal, without renal calculi, focal lesion, or hydronephrosis. Bladder is unremarkable. Stomach/Bowel: Stomach is within normal limits. Appendix appears normal. No evidence of bowel wall thickening, distention, or inflammatory changes. Numerous diverticula throughout the colon  without evidence of diverticulitis. Vascular/Lymphatic: No significant vascular findings are present. No enlarged abdominal or pelvic lymph nodes. Reproductive: Prostate is unremarkable. Other: No abdominal wall hernia or abnormality. No abdominopelvic ascites. 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 IMPRESSION: 1. No acute findings in the abdomen or pelvis. 2. Numerous diverticula throughout the colon without evidence of diverticulitis. 3. 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 Electronically Signed   By: Emmaline Kluver M.D.   On: 01/13/2020 20:46   DG Chest Portable 1 View  Result Date: 01/13/2020 CLINICAL DATA:  Pt with abd pain that radiates up left chest pain ongoing for couple days ago, worse today. + nausea, denies emesis or diarrhea EXAM: PORTABLE  CHEST 1 VIEW COMPARISON:  Chest radiograph 01/08/2020 FINDINGS: Stable cardiomediastinal contours. The lungs are clear. No pneumothorax or significant pleural effusion. No acute finding in the visualized skeleton. IMPRESSION: No acute cardiopulmonary process. Electronically Signed   By: Emmaline Kluver M.D.   On: 01/13/2020 20:33    Procedures Procedures (including critical care time)  Medications Ordered in ED Medications  sodium chloride flush (NS) 0.9 % injection 3 mL (3 mLs Intravenous Given 01/13/20 2003)  iohexol (OMNIPAQUE) 300 MG/ML solution 100 mL (100 mLs Intravenous Contrast Given 01/13/20 2017)  sodium chloride 0.9 % bolus 1,000 mL (1,000 mLs Intravenous New Bag/Given 01/13/20 2041)  morphine 2 MG/ML injection 2 mg (2 mg Intravenous Given 01/13/20 2043)  ondansetron (ZOFRAN) injection 4 mg (4 mg Intravenous Given 01/13/20 2043)    ED Course  I have reviewed the triage vital signs and the nursing notes.  Pertinent labs & imaging results that were available during my care of the patient were reviewed by me and considered in my medical decision making (see  chart for details).    MDM Rules/Calculators/A&P                      Given patient's history of LLQ abdominal pain that has been progressively worsening consistent with his history of diverticulitis, obtained CT abdomen and pelvis with contrast.  While there was diverticular disease appreciated, there was no evidence of diverticulitis or other acute intra-abdominal or pelvic pathology.  Informed patient of his L4-L5 grade 1 anterolisthesis that is an incidental finding and not contributing to his current symptoms.  His laboratory work-up was unremarkable and without any significant findings.  Patient's pain and nausea improved significantly with IV morphine, 1 L IV NS, and IV Zofran 4 mg.  On subsequent evaluation, patient is not requesting any further medications and feels prepared for discharge.  He is very reassured and satisfied by today's comprehensive work-up.  His vital signs have been stable and WNL.  Given the benign exam, the laboratory studies, and unremarkable CT, I have a very low suspicion for appendicitis, ischemic bowel, bowel perforation, or any other life threatening disease. I have discussed with the patient the level of uncertainty with undifferentiated abdominal pain and clearly explained the need to follow-up as noted on the discharge instructions, or return to the Emergency Department immediately if the pain worsens, develops fever, persistent and uncontrollable vomiting, or for any new symptoms or concerns.  Patient is declining any Zofran ODT or analgesics with which to go home.  He plans to get established with a primary care provider.  Prepared for discharge.  All of the evaluation and work-up results were discussed with the patient and any family at bedside. They were provided opportunity to ask any additional questions and have none at this time. They have expressed understanding of verbal discharge instructions as well as return precautions and are agreeable to the plan.     Final Clinical Impression(s) / ED Diagnoses Final diagnoses:  LLQ abdominal pain    Rx / DC Orders ED Discharge Orders    None       Elvera Maria 01/13/20 2150    Pollyann Savoy, MD 01/13/20 2206

## 2020-03-20 ENCOUNTER — Other Ambulatory Visit: Payer: Self-pay

## 2020-03-20 ENCOUNTER — Encounter (HOSPITAL_COMMUNITY): Payer: Self-pay | Admitting: Emergency Medicine

## 2020-03-20 ENCOUNTER — Emergency Department (HOSPITAL_COMMUNITY)
Admission: EM | Admit: 2020-03-20 | Discharge: 2020-03-20 | Disposition: A | Discharge status: Home / Self Care | Payer: Self-pay | Attending: Emergency Medicine | Admitting: Emergency Medicine

## 2020-03-20 ENCOUNTER — Emergency Department (HOSPITAL_COMMUNITY): Payer: Self-pay

## 2020-03-20 DIAGNOSIS — R200 Anesthesia of skin: Secondary | ICD-10-CM | POA: Insufficient documentation

## 2020-03-20 DIAGNOSIS — Z79899 Other long term (current) drug therapy: Secondary | ICD-10-CM | POA: Insufficient documentation

## 2020-03-20 DIAGNOSIS — Z87891 Personal history of nicotine dependence: Secondary | ICD-10-CM | POA: Insufficient documentation

## 2020-03-20 LAB — CBC WITH DIFFERENTIAL/PLATELET
Abs Immature Granulocytes: 0.03 K/uL (ref 0.00–0.07)
Basophils Absolute: 0.1 K/uL (ref 0.0–0.1)
Basophils Relative: 1 %
Eosinophils Absolute: 0.6 K/uL — ABNORMAL HIGH (ref 0.0–0.5)
Eosinophils Relative: 7 %
HCT: 48.1 % (ref 39.0–52.0)
Hemoglobin: 16 g/dL (ref 13.0–17.0)
Immature Granulocytes: 0 %
Lymphocytes Relative: 29 %
Lymphs Abs: 2.5 K/uL (ref 0.7–4.0)
MCH: 30.3 pg (ref 26.0–34.0)
MCHC: 33.3 g/dL (ref 30.0–36.0)
MCV: 91.1 fL (ref 80.0–100.0)
Monocytes Absolute: 0.6 K/uL (ref 0.1–1.0)
Monocytes Relative: 7 %
Neutro Abs: 4.6 K/uL (ref 1.7–7.7)
Neutrophils Relative %: 56 %
Platelets: 295 K/uL (ref 150–400)
RBC: 5.28 MIL/uL (ref 4.22–5.81)
RDW: 13.2 % (ref 11.5–15.5)
WBC: 8.4 K/uL (ref 4.0–10.5)
nRBC: 0 % (ref 0.0–0.2)

## 2020-03-20 LAB — COMPREHENSIVE METABOLIC PANEL WITH GFR
ALT: 29 U/L (ref 0–44)
AST: 21 U/L (ref 15–41)
Albumin: 3.9 g/dL (ref 3.5–5.0)
Alkaline Phosphatase: 68 U/L (ref 38–126)
Anion gap: 9 (ref 5–15)
BUN: 15 mg/dL (ref 6–20)
CO2: 23 mmol/L (ref 22–32)
Calcium: 8.5 mg/dL — ABNORMAL LOW (ref 8.9–10.3)
Chloride: 106 mmol/L (ref 98–111)
Creatinine, Ser: 0.84 mg/dL (ref 0.61–1.24)
GFR calc Af Amer: >60 mL/min (ref >60)
GFR calc non Af Amer: >60 mL/min (ref >60)
Glucose, Bld: 107 mg/dL — ABNORMAL HIGH (ref 70–99)
Potassium: 3.6 mmol/L (ref 3.5–5.1)
Sodium: 138 mmol/L (ref 135–145)
Total Bilirubin: 0.9 mg/dL (ref 0.3–1.2)
Total Protein: 6.8 g/dL (ref 6.5–8.1)

## 2020-03-20 LAB — VITAMIN B12: Vitamin B-12: 337 pg/mL (ref 180–914)

## 2020-03-20 MED ORDER — PREDNISONE 20 MG PO TABS
40.0000 mg | ORAL_TABLET | Freq: Every day | ORAL | 0 refills | Status: DC
Start: 2020-03-20 — End: 2023-01-18

## 2020-03-20 NOTE — ED Provider Notes (Signed)
[facility] EMERGENCY DEPARTMENT Provider Note   CSN: [id number] Arrival date & time: 03/20/20  7253     History Chief Complaint  Patient presents with  . Tingling    r arm     [name] is a 39 y.o. male.  HPI   39 y/o male - has had R arm pain and tingling - started 4 days ago "stocking glove" - distribution.  Worse with laying down - and raising arm and bending over C/o R wrist weakness - dropping tools,  Sx are constant, worse with movement, no assocaited color change / rashes.  No leg sx.  No HA or CN symptoms.  Had L rotator cuff repair in the past. Problems with "disks" -   Past Medical History:  Diagnosis Date  . C. difficile colitis   . Diverticulitis     There are no problems to display for this patient.   Past Surgical History:  Procedure Laterality Date  . APPENDECTOMY    . SHOULDER OPEN ROTATOR CUFF REPAIR Left   . TONSILLECTOMY         No family history on file.  Social History   Tobacco Use  . Smoking status: Former Smoker    Years: 0.20  . Smokeless tobacco: Never Used  Substance Use Topics  . Alcohol use: No  . Drug use: No    Home Medications Prior to Admission medications   Medication Sig Start Date End Date Taking? Authorizing Provider  predniSONE (DELTASONE) 20 MG tablet Take 2 tablets (40 mg total) by mouth daily. 03/20/20   Eber Hong, MD  clonazePAM (KLONOPIN) 0.5 MG tablet Take 1 tablet (0.5 mg total) by mouth 2 (two) times daily as needed for anxiety. Patient not taking: Reported on 03/20/2020 12/20/13 03/20/20  Cathlyn Parsons, NP  ranitidine (ZANTAC) 150 MG capsule Take 1 capsule (150 mg total) by mouth 2 (two) times daily. Patient not taking: Reported on 03/20/2020 12/20/13 03/20/20  Cathlyn Parsons, NP    Allergies    Patient has no known allergies.  Review of Systems   Review of Systems  All other systems reviewed and are negative.   Physical Exam Updated Vital Signs BP 117/66 (BP Location: Left Arm)   Pulse 93    Temp 97.8 F (36.6 C) (Oral)   Resp 18   Ht 1.676 m (5\' 6" )   Wt 102.1 kg   SpO2 96%   BMI 36.32 kg/m   Physical Exam Vitals and nursing note reviewed.  Constitutional:      General: He is not in acute distress.    Appearance: He is well-developed.  HENT:     Head: Normocephalic and atraumatic.     Mouth/Throat:     Pharynx: No oropharyngeal exudate.  Eyes:     General: No scleral icterus.       Right eye: No discharge.        Left eye: No discharge.     Conjunctiva/sclera: Conjunctivae normal.     Pupils: Pupils are equal, round, and reactive to light.  Neck:     Thyroid: No thyromegaly.     Vascular: No JVD.  Cardiovascular:     Rate and Rhythm: Normal rate and regular rhythm.     Heart sounds: Normal heart sounds. No murmur heard.  No friction rub. No gallop.   Pulmonary:     Effort: Pulmonary effort is normal. No respiratory distress.     Breath sounds: Normal breath sounds. No wheezing or  rales.  Abdominal:     General: Bowel sounds are normal. There is no distension.     Palpations: Abdomen is soft. There is no mass.     Tenderness: There is no abdominal tenderness.  Musculoskeletal:        General: No tenderness. Normal range of motion.     Cervical back: Normal range of motion and neck supple.     Comments: Totally normal strength and appearance of bilateral upper and lower extremities  Lymphadenopathy:     Cervical: No cervical adenopathy.  Skin:    General: Skin is warm and dry.     Findings: No erythema or rash.  Neurological:     Mental Status: He is alert.     Coordination: Coordination normal.     Comments: The patient is able to follow commands, normal strength in all 4 extremities in all major muscle groups.  He has normal reflexes at the bilateral patellar tendons as well as the right brachial radialis, he has normal sensation diffusely except for the right arm where there is some spotty decrease sensation below the elbow specifically at the hand  which seems more dense over the thumb and index finger.  Totally normal strength at that hand and normal radial median and ulnar nerve functions with regards to strength  Psychiatric:        Behavior: Behavior normal.     ED Results / Procedures / Treatments   Labs (all labs ordered are listed, but only abnormal results are displayed) Labs Reviewed  CBC WITH DIFFERENTIAL/PLATELET - Abnormal; Notable for the following components:      Result Value   Eosinophils Absolute 0.6 (*)    All other components within normal limits  COMPREHENSIVE METABOLIC PANEL - Abnormal; Notable for the following components:   Glucose, Bld 107 (*)    Calcium 8.5 (*)    All other components within normal limits  VITAMIN B12    EKG None  Radiology MR BRAIN WO CONTRAST  Result Date: 03/20/2020 CLINICAL DATA:  Right arm tingling, increased at night, for 4 days. Evaluate for demyelinating disease EXAM: MRI HEAD WITHOUT CONTRAST TECHNIQUE: Multiplanar, multiecho pulse sequences of the brain and surrounding structures were obtained without intravenous contrast. COMPARISON:  Head CT 10/07/2013 FINDINGS: Brain: No infarction, hemorrhage, hydrocephalus, extra-axial collection or mass lesion. No white matter disease. Vascular: Preserved flow voids Skull and upper cervical spine: Normal marrow signal. Sinuses/Orbits: Negative. IMPRESSION: Normal appearance of the brain.  Negative for demyelinating process. Electronically Signed   By: Marnee Spring M.D.   On: 03/20/2020 11:28   MR CERVICAL SPINE WO CONTRAST  Result Date: 03/20/2020 CLINICAL DATA:  Right arm tingling which is worse at night for 4 days. EXAM: MRI CERVICAL SPINE WITHOUT CONTRAST TECHNIQUE: Multiplanar, multisequence MR imaging of the cervical spine was performed. No intravenous contrast was administered. COMPARISON:  None. FINDINGS: Alignment: Maintained with straightening of lordosis noted. Vertebrae: No fracture, evidence of discitis, or bone lesion. Cord:  Normal signal throughout. Posterior Fossa, vertebral arteries, paraspinal tissues: Negative. Disc levels: C2-3: Negative. C3-4: Negative. C4-5: Small central protrusion without stenosis. C5-6: Negative. C6-7: Shallow central protrusion indents the ventral thecal sac but there is no stenosis. C7-T1: Negative. 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 Electronically Signed   By: Drusilla Kanner M.D.   On: 03/20/2020 11:14    Procedures Procedures (including critical care time)  Medications Ordered in ED Medications - No data to display  ED Course  I  have reviewed the triage vital signs and the nursing notes.  Pertinent labs & imaging results that were available during my care of the patient were reviewed by me and considered in my medical decision making (see chart for details).    MDM Rules/Calculators/A&P                          Frequent headaches, occasional visual changes when driving, right arm symptoms, the patient now states that he has some left leg symptoms that occurred in the room but have now resolved.  Will rule out cervical radiculopathy as well as demyelinating disease, patient agreeable to MRI, vital signs normal  MRI is totally normal showing no signs of demyelinating disease or compressive myelopathy.  Patient stable for discharge can follow-up outpatient with neurology.  Prednisone prescribed  Final Clinical Impression(s) / ED Diagnoses Final diagnoses:  Right arm numbness    Rx / DC Orders ED Discharge Orders         Ordered    predniSONE (DELTASONE) 20 MG tablet  Daily     Discontinue  Reprint     03/20/20 1157           Eber Hong, MD 03/20/20 1158

## 2020-03-20 NOTE — Discharge Instructions (Addendum)
Please see the list of family doctors below if you do not have a local family doctor.  You should follow-up within the week.  Please see the follow-up information for Dr. Gerilyn Pilgrim, he is a local neurologist who can follow-up with you regarding the numbness in your arm, I would recommend that you be seen within the next 2 weeks  Please take prednisone daily for 5 days  Return for severe worsening symptoms  [facility] Doctor List    Kari Baars MD. Specialty: Pulmonary Disease Contact information: [address]  [address]  [place] [address]  [phone]   Syliva Overman, MD. Specialty: Family Medicine Contact information: [address]  [place] [address]  [phone]   Lilyan Punt, MD. Specialty: Family Medicine Contact information: [address]  [place] [address]  [phone]   Avon Gully, MD Specialty: Internal Medicine Contact information: [address] [place] [address]  [phone]   Catalina Pizza, MD. Specialty: Internal Medicine Contact information: [address]  [place] [address]  [phone]    [facility] (Dr. Selena Batten) Specialty: Family Medicine Contact information: [address]  [place] [address]  [phone]   John Giovanni, MD. Specialty: [facility] Contact information: [address]  [address]  [place] [address]  [phone]   Carylon Perches, MD. Specialty: Internal Medicine Contact information: [address]  [address]  [place] [address]  [phone]    [facility]  [address] [place], [place] [address] [phone]  Services [facility] offers a variety of basic health services.  Services include but are not limited to: Blood pressure checks  Heart rate checks  Blood sugar checks  Urine analysis  Rapid strep tests  Pregnancy tests.  Health  education and referrals  People needing more complex services will be directed to a physician online. Using these virtual visits, doctors can evaluate and prescribe medicine and treatments. There will be no medication on-site, though [facility] will help patients fill their prescriptions at little to no cost.   For More information please go to: [email or web address]

## 2020-03-20 NOTE — ED Triage Notes (Signed)
Pt reports R arm tingling, increased at night, x 4 days; reports same feeling intermittently to L arm; pt reports some muscle tightness to R shoulder; pt reports is worse with hand above head and with lying down

## 2020-06-04 ENCOUNTER — Encounter (HOSPITAL_COMMUNITY): Payer: Self-pay | Admitting: Emergency Medicine

## 2020-06-04 ENCOUNTER — Other Ambulatory Visit: Payer: Self-pay

## 2020-06-04 ENCOUNTER — Emergency Department (HOSPITAL_COMMUNITY): Payer: Self-pay

## 2020-06-04 ENCOUNTER — Telehealth (HOSPITAL_COMMUNITY): Payer: Self-pay | Admitting: Emergency Medicine

## 2020-06-04 ENCOUNTER — Emergency Department (HOSPITAL_COMMUNITY)
Admission: EM | Admit: 2020-06-04 | Discharge: 2020-06-04 | Disposition: A | Discharge status: Home / Self Care | Payer: Self-pay | Attending: Emergency Medicine | Admitting: Emergency Medicine

## 2020-06-04 DIAGNOSIS — R5383 Other fatigue: Secondary | ICD-10-CM | POA: Insufficient documentation

## 2020-06-04 DIAGNOSIS — Z87891 Personal history of nicotine dependence: Secondary | ICD-10-CM | POA: Insufficient documentation

## 2020-06-04 DIAGNOSIS — K5732 Diverticulitis of large intestine without perforation or abscess without bleeding: Secondary | ICD-10-CM | POA: Insufficient documentation

## 2020-06-04 DIAGNOSIS — K5792 Diverticulitis of intestine, part unspecified, without perforation or abscess without bleeding: Secondary | ICD-10-CM

## 2020-06-04 LAB — CBC WITH DIFFERENTIAL/PLATELET
Abs Immature Granulocytes: 0.07 10*3/uL (ref 0.00–0.07)
Basophils Absolute: 0.1 10*3/uL (ref 0.0–0.1)
Basophils Relative: 1 %
Eosinophils Absolute: 0.6 10*3/uL — ABNORMAL HIGH (ref 0.0–0.5)
Eosinophils Relative: 4 %
HCT: 50.3 % (ref 39.0–52.0)
Hemoglobin: 16.9 g/dL (ref 13.0–17.0)
Immature Granulocytes: 1 %
Lymphocytes Relative: 17 %
Lymphs Abs: 2.4 10*3/uL (ref 0.7–4.0)
MCH: 30.5 pg (ref 26.0–34.0)
MCHC: 33.6 g/dL (ref 30.0–36.0)
MCV: 90.6 fL (ref 80.0–100.0)
Monocytes Absolute: 0.8 10*3/uL (ref 0.1–1.0)
Monocytes Relative: 6 %
Neutro Abs: 9.9 10*3/uL — ABNORMAL HIGH (ref 1.7–7.7)
Neutrophils Relative %: 71 %
Platelets: 323 10*3/uL (ref 150–400)
RBC: 5.55 MIL/uL (ref 4.22–5.81)
RDW: 12.9 % (ref 11.5–15.5)
WBC: 13.8 10*3/uL — ABNORMAL HIGH (ref 4.0–10.5)
nRBC: 0 % (ref 0.0–0.2)

## 2020-06-04 LAB — COMPREHENSIVE METABOLIC PANEL
ALT: 29 U/L (ref 0–44)
AST: 20 U/L (ref 15–41)
Albumin: 4.3 g/dL (ref 3.5–5.0)
Alkaline Phosphatase: 65 U/L (ref 38–126)
Anion gap: 11 (ref 5–15)
BUN: 18 mg/dL (ref 6–20)
CO2: 22 mmol/L (ref 22–32)
Calcium: 8.9 mg/dL (ref 8.9–10.3)
Chloride: 104 mmol/L (ref 98–111)
Creatinine, Ser: 0.97 mg/dL (ref 0.61–1.24)
GFR calc Af Amer: >60 mL/min (ref >60)
GFR calc non Af Amer: >60 mL/min (ref >60)
Glucose, Bld: 113 mg/dL — ABNORMAL HIGH (ref 70–99)
Potassium: 3.8 mmol/L (ref 3.5–5.1)
Sodium: 137 mmol/L (ref 135–145)
Total Bilirubin: 0.9 mg/dL (ref 0.3–1.2)
Total Protein: 7.2 g/dL (ref 6.5–8.1)

## 2020-06-04 MED ORDER — HYDROMORPHONE HCL 1 MG/ML IJ SOLN
1.0000 mg | Freq: Once | INTRAMUSCULAR | Status: AC
  Administered 2020-06-04: 1 mg via INTRAVENOUS
  Filled 2020-06-04: qty 1

## 2020-06-04 MED ORDER — AMOXICILLIN-POT CLAVULANATE 500-125 MG PO TABS
1.0000 | ORAL_TABLET | Freq: Three times a day (TID) | ORAL | 0 refills | Status: DC
Start: 2020-06-04 — End: 2020-06-04

## 2020-06-04 MED ORDER — SODIUM CHLORIDE 0.9 % IV SOLN
3.0000 g | Freq: Once | INTRAVENOUS | Status: AC
  Administered 2020-06-04: 3 g via INTRAVENOUS
  Filled 2020-06-04: qty 8

## 2020-06-04 MED ORDER — AMOXICILLIN-POT CLAVULANATE 500-125 MG PO TABS
1.0000 | ORAL_TABLET | Freq: Three times a day (TID) | ORAL | 0 refills | Status: DC
Start: 2020-06-04 — End: 2023-01-18

## 2020-06-04 MED ORDER — OXYCODONE-ACETAMINOPHEN 5-325 MG PO TABS: 1.0000 | ORAL_TABLET | Freq: Four times a day (QID) | ORAL | 0 refills | Status: DC | PRN

## 2020-06-04 MED ORDER — MORPHINE SULFATE (PF) 4 MG/ML IV SOLN
4.0000 mg | Freq: Once | INTRAVENOUS | Status: AC
  Administered 2020-06-04: 4 mg via INTRAVENOUS
  Filled 2020-06-04: qty 1

## 2020-06-04 MED ORDER — OXYCODONE-ACETAMINOPHEN 5-325 MG PO TABS: 1.0000 | ORAL_TABLET | ORAL | 0 refills | Status: DC | PRN

## 2020-06-04 MED ORDER — SODIUM CHLORIDE 0.9 % IV BOLUS
1000.0000 mL | Freq: Once | INTRAVENOUS | Status: AC
  Administered 2020-06-04: 1000 mL via INTRAVENOUS

## 2020-06-04 MED ORDER — ONDANSETRON HCL 4 MG/2ML IJ SOLN
4.0000 mg | Freq: Once | INTRAMUSCULAR | Status: AC
  Administered 2020-06-04: 4 mg via INTRAVENOUS
  Filled 2020-06-04: qty 2

## 2020-06-04 NOTE — Discharge Instructions (Signed)
Begin taking Augmentin as prescribed.  Begin taking Percocet as prescribed as needed for pain.  Follow-up with gastroenterology in the next few days to discuss the esophageal finding on your CT scan.  The contact information for Dr. Levon Hedger has been provided in this discharge summary for you to call and make these arrangements.  Return to the emergency department in the meantime if you develop worsening pain, high fever, bloody stool or vomit, or other new and concerning symptoms.

## 2020-06-04 NOTE — ED Provider Notes (Signed)
[facility] EMERGENCY DEPARTMENT Provider Note   CSN: [id number] Arrival date & time: 06/04/20  0435     History Chief Complaint  Patient presents with  . Abdominal Pain    [name] is a 39 y.o. male.  Patient is a 39 year old male with past medical history of diverticulitis and renal calculus.  He presents today for evaluation of left sided abdominal pain.  Patient states he has felt fatigued for several days, then started in the night with the acute onset of pain to the left lower quadrant radiating to his left side.  He denies any bowel or bladder complaints.  He denies any fevers or chills.  The history is provided by the patient.  Abdominal Pain Pain location:  LLQ Pain quality: stabbing   Pain radiates to:  L flank Pain severity:  Moderate Onset quality:  Sudden Duration:  5 hours Timing:  Constant Progression:  Worsening Chronicity:  New Relieved by:  Nothing Worsened by:  Movement and palpation      Past Medical History:  Diagnosis Date  . C. difficile colitis   . Diverticulitis     There are no problems to display for this patient.   Past Surgical History:  Procedure Laterality Date  . APPENDECTOMY    . SHOULDER OPEN ROTATOR CUFF REPAIR Left   . TONSILLECTOMY         No family history on file.  Social History   Tobacco Use  . Smoking status: Former Smoker    Years: 0.20  . Smokeless tobacco: Never Used  Substance Use Topics  . Alcohol use: No  . Drug use: No    Home Medications Prior to Admission medications   Medication Sig Start Date End Date Taking? Authorizing Provider  predniSONE (DELTASONE) 20 MG tablet Take 2 tablets (40 mg total) by mouth daily. 03/20/20   Eber Hong, MD  clonazePAM (KLONOPIN) 0.5 MG tablet Take 1 tablet (0.5 mg total) by mouth 2 (two) times daily as needed for anxiety. Patient not taking: Reported on 03/20/2020 12/20/13 03/20/20  Cathlyn Parsons, NP  ranitidine (ZANTAC) 150 MG capsule Take 1 capsule (150 mg  total) by mouth 2 (two) times daily. Patient not taking: Reported on 03/20/2020 12/20/13 03/20/20  Cathlyn Parsons, NP    Allergies    Patient has no known allergies.  Review of Systems   Review of Systems  Gastrointestinal: Positive for abdominal pain.  All other systems reviewed and are negative.   Physical Exam Updated Vital Signs BP 135/75 (BP Location: Left Arm)   Pulse 85   Resp 17   SpO2 97%   Physical Exam Vitals and nursing note reviewed.  Constitutional:      General: He is not in acute distress.    Appearance: He is well-developed. He is not diaphoretic.  HENT:     Head: Normocephalic and atraumatic.  Cardiovascular:     Rate and Rhythm: Normal rate and regular rhythm.     Heart sounds: No murmur heard.  No friction rub.  Pulmonary:     Effort: Pulmonary effort is normal. No respiratory distress.     Breath sounds: Normal breath sounds. No wheezing or rales.  Abdominal:     General: Bowel sounds are normal. There is no distension.     Palpations: Abdomen is soft.     Tenderness: There is abdominal tenderness in the left lower quadrant. There is no right CVA tenderness, left CVA tenderness, guarding or rebound.  Musculoskeletal:  General: Normal range of motion.     Cervical back: Normal range of motion and neck supple.  Skin:    General: Skin is warm and dry.  Neurological:     Mental Status: He is alert and oriented to person, place, and time.     Coordination: Coordination normal.     ED Results / Procedures / Treatments   Labs (all labs ordered are listed, but only abnormal results are displayed) Labs Reviewed  COMPREHENSIVE METABOLIC PANEL  CBC WITH DIFFERENTIAL/PLATELET  URINALYSIS, ROUTINE W REFLEX MICROSCOPIC    EKG None  Radiology No results found.  Procedures Procedures (including critical care time)  Medications Ordered in ED Medications  morphine 4 MG/ML injection 4 mg (has no administration in time range)  ondansetron  (ZOFRAN) injection 4 mg (has no administration in time range)  sodium chloride 0.9 % bolus 1,000 mL (has no administration in time range)    ED Course  I have reviewed the triage vital signs and the nursing notes.  Pertinent labs & imaging results that were available during my care of the patient were reviewed by me and considered in my medical decision making (see chart for details).    MDM Rules/Calculators/A&P  Patient presenting with complaints of left lower quadrant pain.  He has a history of diverticulitis and renal calculi.  Patient's white count is 13.8 and CT scan shows acute, uncomplicated diverticulitis of the descending colon.  This will be treated with a dose of IV Unasyn followed by Augmentin as an outpatient.  He will also be prescribed medication for his discomfort and given a work excuse for the next 3 days.  Also noted on his CT scan is a 2 cm exophytic nodule posteriorly from the lower esophagus which could reflect a collapsed diverticulum or soft tissue mass.  GI follow-up as been recommended.  I informed the patient of this finding and will make a referral to GI so that they can discuss how to proceed with this.  Patient is otherwise nontoxic-appearing and in no acute distress.  Is feeling better after receiving medication here in the ER.  Final Clinical Impression(s) / ED Diagnoses Final diagnoses:  None    Rx / DC Orders ED Discharge Orders    None       Geoffery Lyons, MD 06/04/20 [phone]

## 2020-06-04 NOTE — Telephone Encounter (Signed)
Pt called dept - prescriptions sent to [facility] in [place], requesting prescriptions sent to [facility] in [place].  This was completed.  Charge RN call to [employer] to cancel scripts with them.

## 2020-06-04 NOTE — ED Triage Notes (Signed)
Pt C/O lower abdominal pain and nausea that started at 0330 this morning. Pt denies fevers.

## 2020-09-09 ENCOUNTER — Ambulatory Visit
Admission: EM | Admit: 2020-09-09 | Discharge: 2020-09-09 | Disposition: A | Discharge status: Home / Self Care | Payer: Medicaid Other | Attending: Physician Assistant | Admitting: Physician Assistant

## 2020-09-09 ENCOUNTER — Other Ambulatory Visit: Payer: Self-pay

## 2020-09-09 DIAGNOSIS — J069 Acute upper respiratory infection, unspecified: Secondary | ICD-10-CM

## 2020-09-09 NOTE — ED Triage Notes (Signed)
Pt presents with complaints of sore throat, nasal congestion, chest congestion, and cough that started yesterday. Reports his temperature was 99 at home. Reports he had covid 6 weeks ago.

## 2020-09-09 NOTE — ED Provider Notes (Signed)
[facility]    CSN: [id number] Arrival date & time: 09/09/20  2831      History   Chief Complaint Chief Complaint  Patient presents with  . Sore Throat    HPI [name] is a 39 y.o. male.   The history is provided by the patient. No language interpreter was used.  Sore Throat This is a new problem. The current episode started yesterday. The problem occurs constantly. The problem has been gradually worsening. Associated symptoms include headaches. Pertinent negatives include no shortness of breath. Nothing relieves the symptoms. He has tried nothing for the symptoms. The treatment provided no relief.  Pt had covid 2 months ago  Past Medical History:  Diagnosis Date  . C. difficile colitis   . Diverticulitis     There are no problems to display for this patient.   Past Surgical History:  Procedure Laterality Date  . APPENDECTOMY    . SHOULDER OPEN ROTATOR CUFF REPAIR Left   . TONSILLECTOMY         Home Medications    Prior to Admission medications   Medication Sig Start Date End Date Taking? Authorizing Provider  amoxicillin-clavulanate (AUGMENTIN) 500-125 MG tablet Take 1 tablet (500 mg total) by mouth 3 (three) times daily. 06/04/20   Burgess Amor, PA-C  oxyCODONE-acetaminophen (PERCOCET/ROXICET) 5-325 MG tablet Take 1 tablet by mouth every 4 (four) hours as needed. 06/04/20   Burgess Amor, PA-C  predniSONE (DELTASONE) 20 MG tablet Take 2 tablets (40 mg total) by mouth daily. 03/20/20   Eber Hong, MD  clonazePAM (KLONOPIN) 0.5 MG tablet Take 1 tablet (0.5 mg total) by mouth 2 (two) times daily as needed for anxiety. Patient not taking: Reported on 03/20/2020 12/20/13 03/20/20  Cathlyn Parsons, NP  ranitidine (ZANTAC) 150 MG capsule Take 1 capsule (150 mg total) by mouth 2 (two) times daily. Patient not taking: Reported on 03/20/2020 12/20/13 03/20/20  Cathlyn Parsons, NP    Family History Family History  Problem Relation Age of Onset  . Healthy Mother    . Healthy Father     Social History Social History   Tobacco Use  . Smoking status: Former Smoker    Years: 0.20  . Smokeless tobacco: Never Used  Substance Use Topics  . Alcohol use: No  . Drug use: No     Allergies   Patient has no known allergies.   Review of Systems Review of Systems  HENT: Positive for sore throat.   Respiratory: Negative for shortness of breath.   Neurological: Positive for headaches.  All other systems reviewed and are negative.    Physical Exam Triage Vital Signs ED Triage Vitals  Enc Vitals Group     BP 09/09/20 0858 129/79     Pulse Rate 09/09/20 0858 (!) 108     Resp 09/09/20 0858 19     Temp 09/09/20 0858 98.7 F (37.1 C)     Temp src --      SpO2 09/09/20 0858 100 %     Weight --      Height --      Head Circumference --      Peak Flow --      Pain Score 09/09/20 0857 0     Pain Loc --      Pain Edu? --      Excl. in GC? --    No data found.  Updated Vital Signs BP 129/79   Pulse (!) 108  Temp 98.7 F (37.1 C)   Resp 19   SpO2 100%   Visual Acuity Right Eye Distance:   Left Eye Distance:   Bilateral Distance:    Right Eye Near:   Left Eye Near:    Bilateral Near:     Physical Exam Vitals and nursing note reviewed.  Constitutional:      Appearance: He is well-developed and well-nourished.  HENT:     Head: Normocephalic.     Right Ear: Tympanic membrane normal.     Mouth/Throat:     Pharynx: Posterior oropharyngeal erythema present.  Eyes:     Extraocular Movements: EOM normal.     Conjunctiva/sclera: Conjunctivae normal.  Cardiovascular:     Rate and Rhythm: Normal rate.     Heart sounds: Normal heart sounds.  Pulmonary:     Effort: Pulmonary effort is normal.  Abdominal:     General: There is no distension.     Palpations: Abdomen is soft.  Musculoskeletal:        General: Normal range of motion.     Cervical back: Normal range of motion.  Skin:    General: Skin is warm.  Neurological:      Mental Status: He is alert and oriented to person, place, and time.  Psychiatric:        Mood and Affect: Mood and affect and mood normal.      UC Treatments / Results  Labs (all labs ordered are listed, but only abnormal results are displayed) Labs Reviewed  COVID-19, FLU A+B NAA    EKG   Radiology No results found.  Procedures Procedures (including critical care time)  Medications Ordered in UC Medications - No data to display  Initial Impression / Assessment and Plan / UC Course  I have reviewed the triage vital signs and the nursing notes.  Pertinent labs & imaging results that were available during my care of the patient were reviewed by me and considered in my medical decision making (see chart for details).     covid and influenza pending Final Clinical Impressions(s) / UC Diagnoses   Final diagnoses:  Acute upper respiratory infection     Discharge Instructions     Covid and influenza are pending   ED Prescriptions    None     PDMP not reviewed this encounter.  An After Visit Summary was printed and given to the patient.    Elson Areas, [place] 09/09/20 [phone]

## 2020-09-09 NOTE — Discharge Instructions (Addendum)
Covid and influenza are pending

## 2020-09-11 LAB — COVID-19, FLU A+B NAA
Influenza A, NAA: NOT DETECTED
Influenza B, NAA: NOT DETECTED
SARS-CoV-2, NAA: NOT DETECTED

## 2021-04-04 IMAGING — CT CT ABD-PELV W/ CM
2 of 4 series · 17 of 46 positions shown, 19 images · IV contrast (Omnipaque or Isovue)
Comparison: None.

CLINICAL DATA: Left sided abd pain that radiates up left chest pain
ongoing for couple days ago, worse today. + nausea, denies emesis or
diarrhea.

EXAM:
CT ABDOMEN AND PELVIS WITH CONTRAST
TECHNIQUE: Multidetector CT imaging of the abdomen and pelvis was performed
using the standard protocol following bolus administration of
intravenous contrast.
CONTRAST:  100mL OMNIPAQUE IOHEXOL 300 MG/ML  SOLN

[Series 2: axial st · axial · 0.93mm/px · z∈[+685,+1125]mm · 14 of 98 slices shown, 16 images]
[im 5/98  soft-tissue]
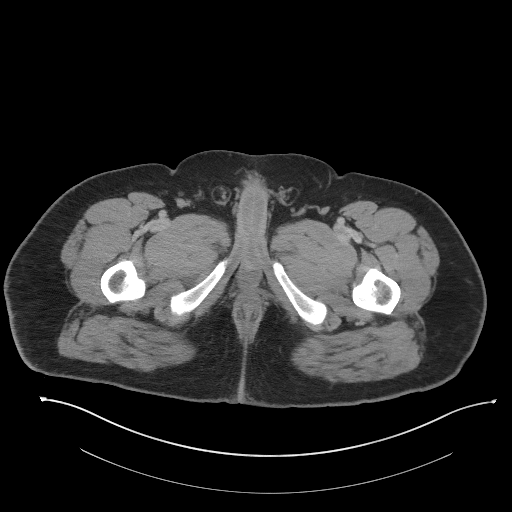
[im 5/98  bone]
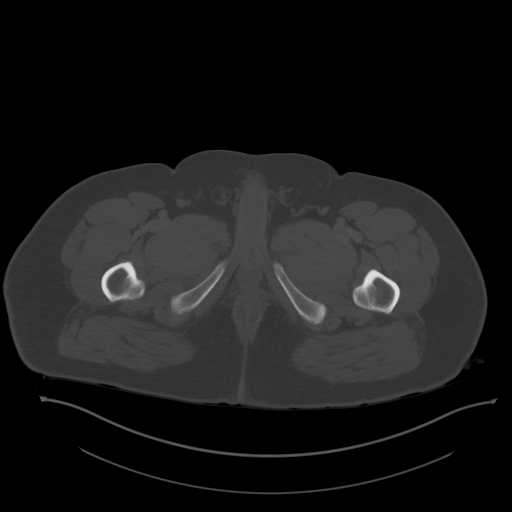
[im 14/98  soft-tissue]
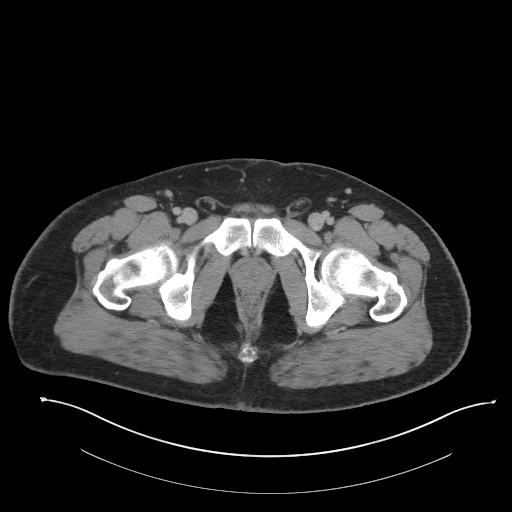
[im 18/98  soft-tissue]
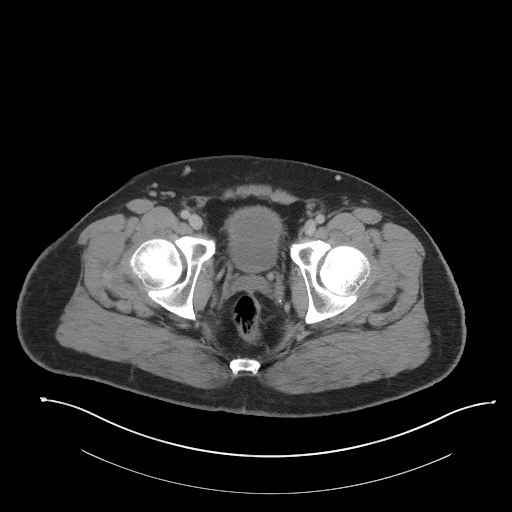
[im 27/98  soft-tissue]
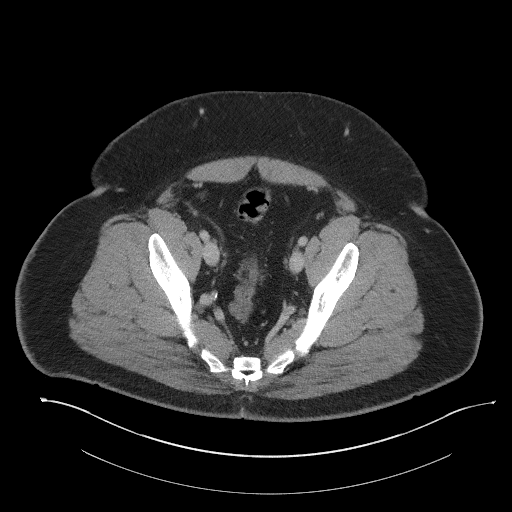
[im 31/98  soft-tissue]
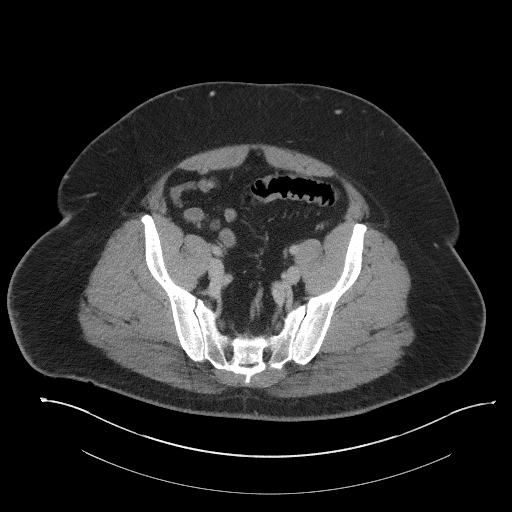
[im 40/98  soft-tissue]
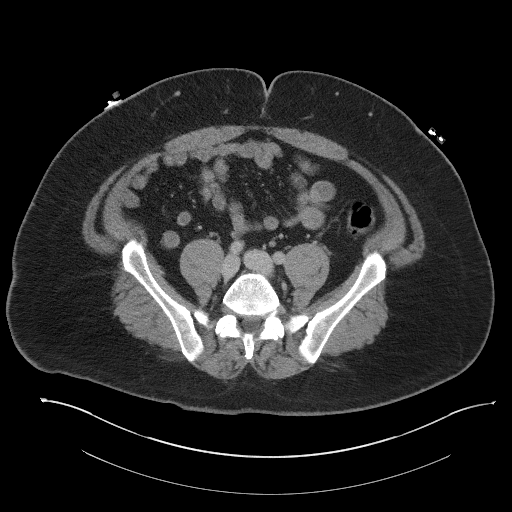
[im 45/98  soft-tissue]
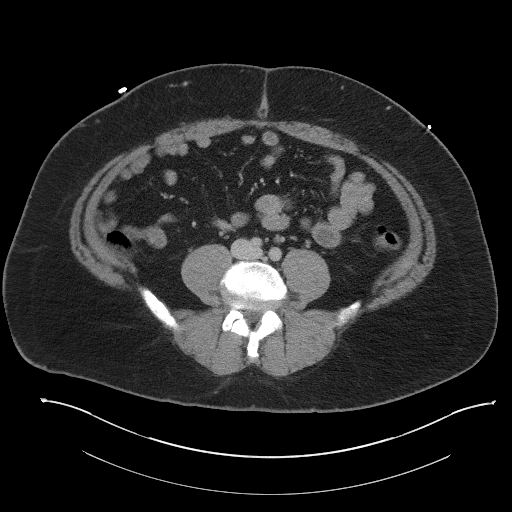
[im 53/98  soft-tissue]
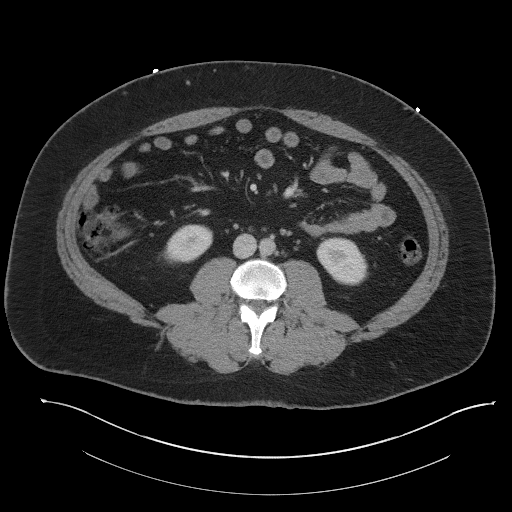
[im 58/98  soft-tissue]
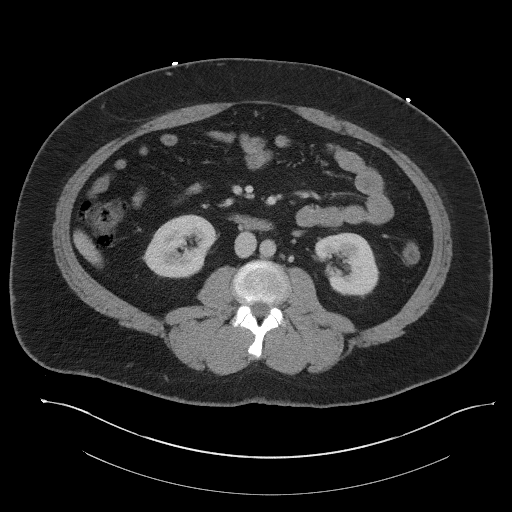
[im 58/98  bone]
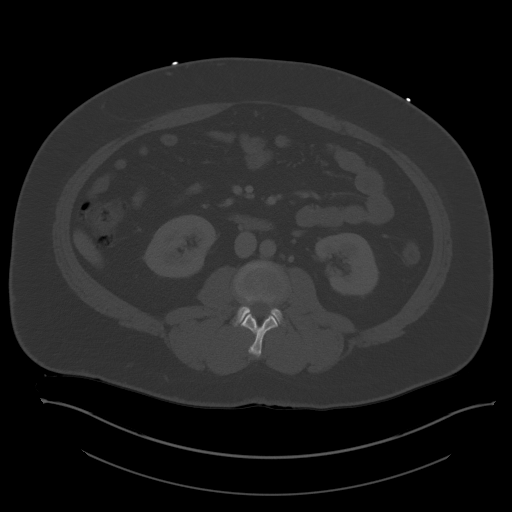
[im 67/98  soft-tissue]
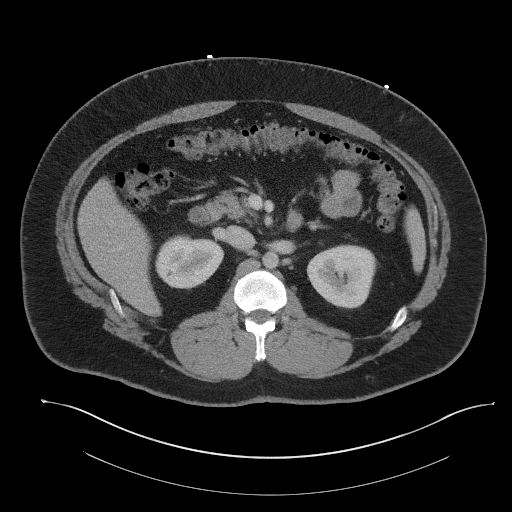
[im 71/98  soft-tissue]
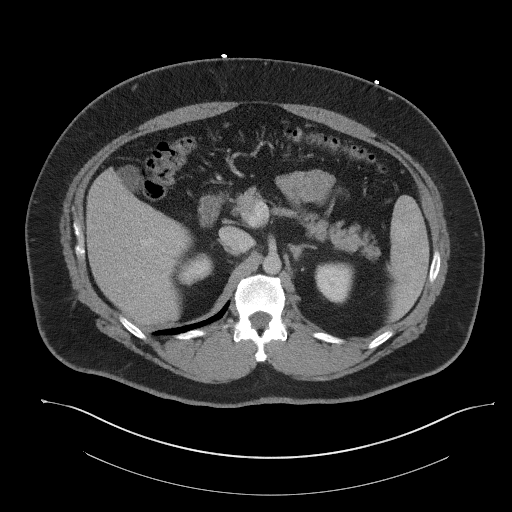
[im 80/98  soft-tissue]
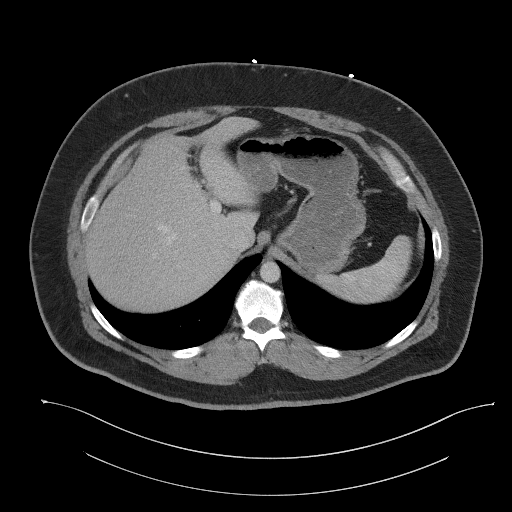
[im 84/98  soft-tissue]
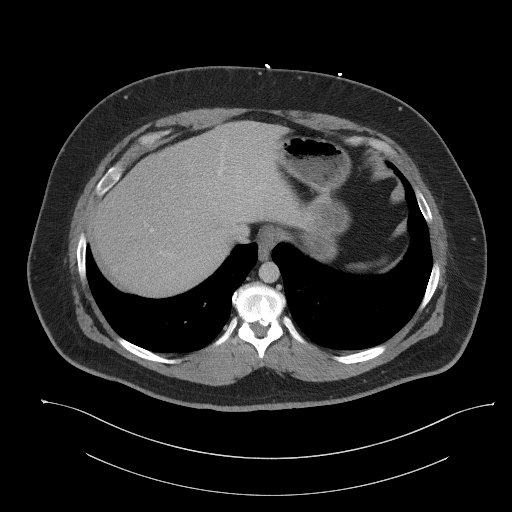
[im 93/98  soft-tissue]
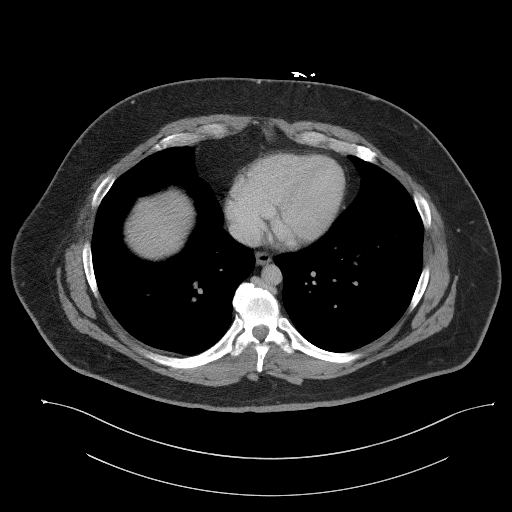

[Series 5: coronal st · coronal · 0.96mm/px · 3 of 118 slices shown]
[im 40/118  soft-tissue]
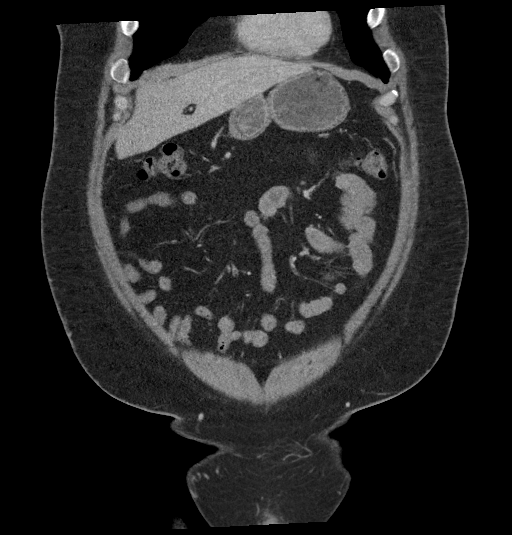
[im 53/118  soft-tissue]
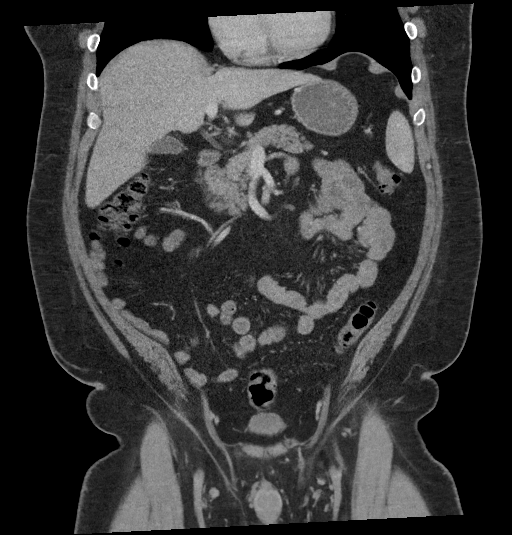
[im 66/118  soft-tissue]
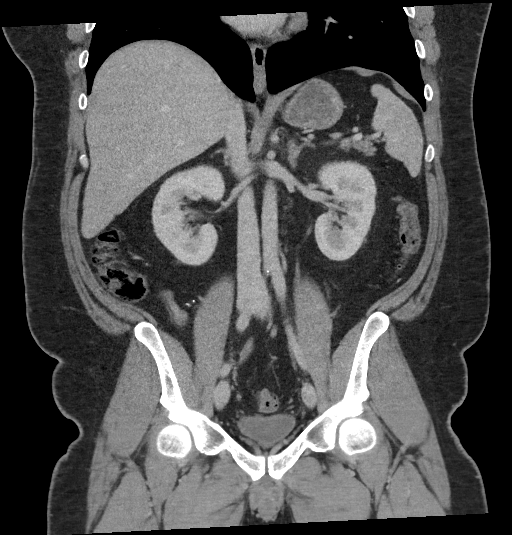

[17 of 46 positions shown; findings below may reference images not displayed]

FINDINGS: Lower chest: No acute abnormality.

Hepatobiliary: No focal liver abnormality is seen. No gallstones,
gallbladder wall thickening, or biliary dilatation.

Pancreas: Unremarkable. No pancreatic ductal dilatation or
surrounding inflammatory changes.

Spleen: Normal in size without focal abnormality.

Adrenals/Urinary Tract: Adrenal glands are unremarkable. Kidneys are
normal, without renal calculi, focal lesion, or hydronephrosis.
Bladder is unremarkable.

Stomach/Bowel: Stomach is within normal limits. Appendix appears
normal. No evidence of bowel wall thickening, distention, or
inflammatory changes. Numerous diverticula throughout the colon
without evidence of diverticulitis.

Vascular/Lymphatic: No significant vascular findings are present. No
enlarged abdominal or pelvic lymph nodes.

Reproductive: Prostate is unremarkable.

Other: No abdominal wall hernia or abnormality. No abdominopelvic
ascites.

Musculoskeletal: Grade 1 anterolisthesis of L4 on L5 secondary to
bilateral pars defects.
IMPRESSION: 1. No acute findings in the abdomen or pelvis.
2. Numerous diverticula throughout the colon without evidence of
diverticulitis.
3. Grade 1 anterolisthesis of L4 on L5 secondary to bilateral pars
defects.

## 2022-04-30 DIAGNOSIS — K5720 Diverticulitis of large intestine with perforation and abscess without bleeding: Secondary | ICD-10-CM | POA: Insufficient documentation

## 2023-01-18 ENCOUNTER — Encounter: Payer: Self-pay | Admitting: Internal Medicine

## 2023-01-18 ENCOUNTER — Ambulatory Visit (INDEPENDENT_AMBULATORY_CARE_PROVIDER_SITE_OTHER): Payer: BC Managed Care – PPO | Admitting: Internal Medicine

## 2023-01-18 VITALS — BP 116/80 | HR 85 | Resp 16 | Ht 66.0 in | Wt 225.0 lb

## 2023-01-18 DIAGNOSIS — Z7689 Persons encountering health services in other specified circumstances: Secondary | ICD-10-CM | POA: Insufficient documentation

## 2023-01-18 DIAGNOSIS — F329 Major depressive disorder, single episode, unspecified: Secondary | ICD-10-CM | POA: Insufficient documentation

## 2023-01-18 DIAGNOSIS — Z0001 Encounter for general adult medical examination with abnormal findings: Secondary | ICD-10-CM

## 2023-01-18 DIAGNOSIS — F322 Major depressive disorder, single episode, severe without psychotic features: Secondary | ICD-10-CM | POA: Diagnosis not present

## 2023-01-18 DIAGNOSIS — E669 Obesity, unspecified: Secondary | ICD-10-CM

## 2023-01-18 DIAGNOSIS — F411 Generalized anxiety disorder: Secondary | ICD-10-CM | POA: Insufficient documentation

## 2023-01-18 MED ORDER — SERTRALINE HCL 25 MG PO TABS
25.0000 mg | ORAL_TABLET | Freq: Every day | ORAL | 0 refills | Status: DC
Start: 2023-01-18 — End: 2023-02-15

## 2023-01-18 NOTE — Patient Instructions (Addendum)
Thank you, Mr.Celene Skeen for allowing [place] to provide your care today.   I have ordered the following labs for you:  Lab Orders         CBC with Differential/Platelet         CMP14+EGFR         TSH         Hemoglobin A1c         Lipid panel         VITAMIN D 25 Hydroxy (Vit-D Deficiency, Fractures)         Hepatitis C antibody         HIV Antibody (routine testing w rflx)        Reminders: Follow up in 4 weeks    Thurmon Fair, M.D.

## 2023-01-18 NOTE — Assessment & Plan Note (Signed)
Check baseline CBC and CMP Screen for Hep C and HIV in low risk individual

## 2023-01-18 NOTE — Progress Notes (Signed)
     HPI:[name] is a 42 y.o. male who has not routinely followed with a primary care provider  who presents to establish care and has acute concern of anxiety and depression. He was treated for ADHD, MDD, Bipolar Disorder, and panic attacks as a teenager. He was prescribed Zoloft, xanax , and Vyvanse. He has not been sleeping well recently. He depression and anxiety have been worse for the last 2 years since his mother died. He is currently going to the gym almost daily and is down 20 pounds. He eats a high protein diet. He has always like lifting weight.  He is father to 61 children. He has a 38 year old grandchild. He has already eaten this morning and okay with returning for his fasting blood work.  For the details of today's visit, please refer to the assessment and plan.   Past Medical History:  Diagnosis Date   C. difficile colitis    Diverticulitis     Past Surgical History:  Procedure Laterality Date   APPENDECTOMY     SHOULDER OPEN ROTATOR CUFF REPAIR Left    TONSILLECTOMY      Family History  Problem Relation Age of Onset   Healthy Mother    Healthy Father    Cancer Paternal Grandmother     Social History   Tobacco Use   Smoking status: Former    Packs/day: .2    Types: Cigarettes    Start date: 1997    Quit date: 2019    Years since quitting: 5.3   Smokeless tobacco: Never  Substance Use Topics   Alcohol use: No    Comment: occasional at most   Drug use: No    Physical Exam: Vitals:   01/18/23 0806  BP: 116/80  Pulse: 85  Resp: 16  SpO2: 94%  Weight: 225 lb (102.1 kg)  Height: 5\' 6"  (1.676 m)     Physical Exam Constitutional:      Appearance: He is well-developed and well-groomed. He is obese.  Eyes:     General: No scleral icterus.    Conjunctiva/sclera: Conjunctivae normal.  Cardiovascular:     Rate and Rhythm: Normal rate and regular rhythm.     Heart sounds: No murmur heard.    No friction rub. No gallop.  Pulmonary:      Effort: Pulmonary effort is normal.     Breath sounds: No wheezing, rhonchi or rales.  Musculoskeletal:     Right lower leg: No edema.     Left lower leg: No edema.  Skin:    General: Skin is warm and dry.      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 Continue diet and lifestyle changes he has made.  Check TSH, Vit D, Hemoglobin A1c , and lipid panel   Encounter to establish care Check baseline CBC and CMP Screen for Hep C and HIV in low risk individual  MDD (major depressive disorder) PHQ-9 score 18.  Start Zoloft 25 mg Follow up in 4 weeks  GAD (generalized anxiety disorder) GAD 7 16. Starting Zoloft 25 mg  Follow up in 4 weeks     Milus Banister, MD

## 2023-01-18 NOTE — Assessment & Plan Note (Addendum)
PHQ-9 score 18.  Start Zoloft 25 mg Follow up in 4 weeks

## 2023-01-18 NOTE — Assessment & Plan Note (Signed)
BMI 36, weight 225 . Continue diet and lifestyle changes he has made.  Check TSH, Vit D, Hemoglobin A1c , and lipid panel

## 2023-01-18 NOTE — Assessment & Plan Note (Signed)
GAD 7 16. Starting Zoloft 25 mg  Follow up in 4 weeks

## 2023-01-19 DIAGNOSIS — Z0001 Encounter for general adult medical examination with abnormal findings: Secondary | ICD-10-CM | POA: Diagnosis not present

## 2023-01-19 DIAGNOSIS — E669 Obesity, unspecified: Secondary | ICD-10-CM | POA: Diagnosis not present

## 2023-01-20 LAB — CMP14+EGFR
ALT: 27 IU/L (ref 0–44)
AST: 26 IU/L (ref 0–40)
Albumin/Globulin Ratio: 1.7 (ref 1.2–2.2)
Albumin: 4.5 g/dL (ref 4.1–5.1)
Alkaline Phosphatase: 85 IU/L (ref 44–121)
BUN/Creatinine Ratio: 23 — ABNORMAL HIGH (ref 9–20)
BUN: 21 mg/dL (ref 6–24)
Bilirubin Total: 0.8 mg/dL (ref 0.0–1.2)
CO2: 22 mmol/L (ref 20–29)
Calcium: 9 mg/dL (ref 8.7–10.2)
Chloride: 104 mmol/L (ref 96–106)
Creatinine, Ser: 0.91 mg/dL (ref 0.76–1.27)
Globulin, Total: 2.7 g/dL (ref 1.5–4.5)
Glucose: 100 mg/dL — ABNORMAL HIGH (ref 70–99)
Potassium: 4.6 mmol/L (ref 3.5–5.2)
Sodium: 141 mmol/L (ref 134–144)
Total Protein: 7.2 g/dL (ref 6.0–8.5)
eGFR: 109 mL/min/{1.73_m2} (ref >59)

## 2023-01-20 LAB — CBC WITH DIFFERENTIAL/PLATELET
Basophils Absolute: 0 10*3/uL (ref 0.0–0.2)
Basos: 1 %
EOS (ABSOLUTE): 0.3 10*3/uL (ref 0.0–0.4)
Eos: 4 %
Hematocrit: 52.5 % — ABNORMAL HIGH (ref 37.5–51.0)
Hemoglobin: 17.9 g/dL — ABNORMAL HIGH (ref 13.0–17.7)
Immature Grans (Abs): 0 10*3/uL (ref 0.0–0.1)
Immature Granulocytes: 0 %
Lymphocytes Absolute: 3.5 10*3/uL — ABNORMAL HIGH (ref 0.7–3.1)
Lymphs: 44 %
MCH: 30.9 pg (ref 26.6–33.0)
MCHC: 34.1 g/dL (ref 31.5–35.7)
MCV: 91 fL (ref 79–97)
Monocytes Absolute: 0.4 10*3/uL (ref 0.1–0.9)
Monocytes: 5 %
Neutrophils Absolute: 3.6 10*3/uL (ref 1.4–7.0)
Neutrophils: 46 %
Platelets: 268 10*3/uL (ref 150–450)
RBC: 5.8 x10E6/uL (ref 4.14–5.80)
RDW: 12.2 % (ref 11.6–15.4)
WBC: 7.9 10*3/uL (ref 3.4–10.8)

## 2023-01-20 LAB — LIPID PANEL
Chol/HDL Ratio: 4.9 ratio (ref 0.0–5.0)
Cholesterol, Total: 180 mg/dL (ref 100–199)
HDL: 37 mg/dL — ABNORMAL LOW (ref >39)
LDL Chol Calc (NIH): 119 mg/dL — ABNORMAL HIGH (ref 0–99)
Triglycerides: 131 mg/dL (ref 0–149)
VLDL Cholesterol Cal: 24 mg/dL (ref 5–40)

## 2023-01-20 LAB — TSH: TSH: 0.538 u[IU]/mL (ref 0.450–4.500)

## 2023-01-20 LAB — HIV ANTIBODY (ROUTINE TESTING W REFLEX): HIV Screen 4th Generation wRfx: NONREACTIVE

## 2023-01-20 LAB — HEMOGLOBIN A1C
Est. average glucose Bld gHb Est-mCnc: 108 mg/dL
Hgb A1c MFr Bld: 5.4 % (ref 4.8–5.6)

## 2023-01-20 LAB — VITAMIN D 25 HYDROXY (VIT D DEFICIENCY, FRACTURES): Vit D, 25-Hydroxy: 53.8 ng/mL (ref 30.0–100.0)

## 2023-01-20 LAB — HEPATITIS C ANTIBODY: Hep C Virus Ab: NONREACTIVE

## 2023-02-15 ENCOUNTER — Ambulatory Visit (HOSPITAL_COMMUNITY)
Admission: RE | Admit: 2023-02-15 | Discharge: 2023-02-15 | Disposition: A | Discharge status: Home / Self Care | Payer: BC Managed Care – PPO | Source: Ambulatory Visit | Attending: Internal Medicine | Admitting: Internal Medicine

## 2023-02-15 ENCOUNTER — Encounter: Payer: Self-pay | Admitting: Internal Medicine

## 2023-02-15 ENCOUNTER — Ambulatory Visit (INDEPENDENT_AMBULATORY_CARE_PROVIDER_SITE_OTHER): Payer: BC Managed Care – PPO | Admitting: Internal Medicine

## 2023-02-15 VITALS — BP 122/83 | HR 94 | Ht 66.0 in | Wt 227.4 lb

## 2023-02-15 DIAGNOSIS — D582 Other hemoglobinopathies: Secondary | ICD-10-CM | POA: Diagnosis not present

## 2023-02-15 DIAGNOSIS — R200 Anesthesia of skin: Secondary | ICD-10-CM | POA: Insufficient documentation

## 2023-02-15 DIAGNOSIS — F411 Generalized anxiety disorder: Secondary | ICD-10-CM | POA: Diagnosis not present

## 2023-02-15 DIAGNOSIS — F320 Major depressive disorder, single episode, mild: Secondary | ICD-10-CM | POA: Diagnosis not present

## 2023-02-15 DIAGNOSIS — J329 Chronic sinusitis, unspecified: Secondary | ICD-10-CM | POA: Insufficient documentation

## 2023-02-15 DIAGNOSIS — E782 Mixed hyperlipidemia: Secondary | ICD-10-CM

## 2023-02-15 DIAGNOSIS — B9689 Other specified bacterial agents as the cause of diseases classified elsewhere: Secondary | ICD-10-CM | POA: Insufficient documentation

## 2023-02-15 DIAGNOSIS — E785 Hyperlipidemia, unspecified: Secondary | ICD-10-CM | POA: Insufficient documentation

## 2023-02-15 MED ORDER — FLUTICASONE PROPIONATE 50 MCG/ACT NA SUSP
2.0000 | Freq: Every day | NASAL | 6 refills | Status: AC
Start: 2023-02-15 — End: ?

## 2023-02-15 MED ORDER — HYDROXYZINE PAMOATE 25 MG PO CAPS
25.0000 mg | ORAL_CAPSULE | Freq: Three times a day (TID) | ORAL | 0 refills | Status: DC | PRN
Start: 2023-02-15 — End: 2023-12-15

## 2023-02-15 MED ORDER — SERTRALINE HCL 50 MG PO TABS
50.0000 mg | ORAL_TABLET | Freq: Every day | ORAL | 3 refills | Status: AC
Start: 2023-02-15 — End: ?

## 2023-02-15 NOTE — Assessment & Plan Note (Signed)
Fluticasone prescribed for as needed symptom relief

## 2023-02-15 NOTE — Assessment & Plan Note (Signed)
PHQ-9 score has improved.  As noted above, sertraline has been increased to 50 mg daily.

## 2023-02-15 NOTE — Assessment & Plan Note (Signed)
Presenting today for follow-up of anxiety and depression.  Sertraline 25 mg daily was started at his last appointment.  GAD-7 score has improved today.  He states that he still feels "on edge" during the day but has not had any panic attacks since starting sertraline. -Increase sertraline 50 mg daily -Add hydroxyzine for as needed anxiety relief

## 2023-02-15 NOTE — Patient Instructions (Signed)
It was a pleasure to see you today.  Thank you for giving [place] the opportunity to be involved in your care.  Below is a brief recap of your visit and next steps.  We will plan to see you again in 6 months.  Summary Increase Zoloft to 50 mg daily Add hydroxyzine for as needed anxiety relief Neck xrays ordered Try flonase for sinus relief Follow up in 6 months

## 2023-02-15 NOTE — Assessment & Plan Note (Signed)
Noted on recent labs.  Total cholesterol 180 and LDL 119.  His 10-year ASCVD risk is 1.4%. -No indication for statin therapy.  Lifestyle modifications aimed at improving his cholesterol were reviewed today.
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
## 2023-02-15 NOTE — Assessment & Plan Note (Signed)
Noted on recent labs.  Suspect secondary to testosterone replacement therapy, which he has recently stopped. -Repeat CBC ordered today
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
## 2023-02-16 LAB — CBC WITH DIFFERENTIAL/PLATELET
Basophils Absolute: 0.1 10*3/uL (ref 0.0–0.2)
Basos: 1 %
EOS (ABSOLUTE): 0.3 10*3/uL (ref 0.0–0.4)
Eos: 3 %
Hematocrit: 50 % (ref 37.5–51.0)
Hemoglobin: 16.7 g/dL (ref 13.0–17.7)
Immature Grans (Abs): 0 10*3/uL (ref 0.0–0.1)
Immature Granulocytes: 0 %
Lymphocytes Absolute: 3.7 10*3/uL — ABNORMAL HIGH (ref 0.7–3.1)
Lymphs: 39 %
MCH: 29.8 pg (ref 26.6–33.0)
MCHC: 33.4 g/dL (ref 31.5–35.7)
MCV: 89 fL (ref 79–97)
Monocytes Absolute: 0.5 10*3/uL (ref 0.1–0.9)
Monocytes: 5 %
Neutrophils Absolute: 4.9 10*3/uL (ref 1.4–7.0)
Neutrophils: 52 %
Platelets: 307 10*3/uL (ref 150–450)
RBC: 5.6 x10E6/uL (ref 4.14–5.80)
RDW: 12.3 % (ref 11.6–15.4)
WBC: 9.5 10*3/uL (ref 3.4–10.8)

## 2023-02-17 ENCOUNTER — Other Ambulatory Visit: Payer: Self-pay | Admitting: Internal Medicine

## 2023-02-17 ENCOUNTER — Encounter: Payer: Self-pay | Admitting: Internal Medicine

## 2023-02-17 DIAGNOSIS — D72820 Lymphocytosis (symptomatic): Secondary | ICD-10-CM

## 2023-02-21 DIAGNOSIS — D72820 Lymphocytosis (symptomatic): Secondary | ICD-10-CM | POA: Diagnosis not present

## 2023-02-22 LAB — CBC WITH DIFFERENTIAL/PLATELET
Basophils Absolute: 0 10*3/uL (ref 0.0–0.2)
Basos: 0 %
EOS (ABSOLUTE): 0.3 10*3/uL (ref 0.0–0.4)
Eos: 3 %
Hematocrit: 49.5 % (ref 37.5–51.0)
Hemoglobin: 16.9 g/dL (ref 13.0–17.7)
Immature Grans (Abs): 0 10*3/uL (ref 0.0–0.1)
Immature Granulocytes: 0 %
Lymphocytes Absolute: 3.7 10*3/uL — ABNORMAL HIGH (ref 0.7–3.1)
Lymphs: 38 %
MCH: 30.8 pg (ref 26.6–33.0)
MCHC: 34.1 g/dL (ref 31.5–35.7)
MCV: 90 fL (ref 79–97)
Monocytes Absolute: 0.5 10*3/uL (ref 0.1–0.9)
Monocytes: 5 %
Neutrophils Absolute: 5.2 10*3/uL (ref 1.4–7.0)
Neutrophils: 54 %
Platelets: 310 10*3/uL (ref 150–450)
RBC: 5.49 x10E6/uL (ref 4.14–5.80)
RDW: 12.5 % (ref 11.6–15.4)
WBC: 9.7 10*3/uL (ref 3.4–10.8)

## 2023-02-23 ENCOUNTER — Telehealth: Payer: Self-pay | Admitting: Internal Medicine

## 2023-02-23 LAB — COMP PANEL: LEUKEMIA/LYMPHOMA

## 2023-02-23 NOTE — Telephone Encounter (Signed)
Patient called about test results, asking for a call back this afternoon

## 2023-02-23 NOTE — Telephone Encounter (Signed)
Spoke with patient.

## 2023-02-24 LAB — PATHOLOGIST SMEAR REVIEW
Eos: 4 %
Hematocrit: 51.5 % — ABNORMAL HIGH (ref 37.5–51.0)
MCV: 93 fL (ref 79–97)
Platelets: 324 10*3/uL (ref 150–450)

## 2023-02-25 ENCOUNTER — Other Ambulatory Visit: Payer: Self-pay | Admitting: Internal Medicine

## 2023-02-25 ENCOUNTER — Other Ambulatory Visit: Payer: Self-pay

## 2023-02-25 DIAGNOSIS — D72820 Lymphocytosis (symptomatic): Secondary | ICD-10-CM

## 2023-02-25 LAB — PATHOLOGIST SMEAR REVIEW
Basophils Absolute: 0.1 10*3/uL (ref 0.0–0.2)
Basos: 1 %
EOS (ABSOLUTE): 0.4 10*3/uL (ref 0.0–0.4)
Hemoglobin: 17 g/dL (ref 13.0–17.7)
Immature Grans (Abs): 0 10*3/uL (ref 0.0–0.1)
Immature Granulocytes: 0 %
Lymphocytes Absolute: 3.7 10*3/uL — ABNORMAL HIGH (ref 0.7–3.1)
Lymphs: 37 %
MCH: 30.6 pg (ref 26.6–33.0)
MCHC: 33 g/dL (ref 31.5–35.7)
Monocytes Absolute: 0.5 10*3/uL (ref 0.1–0.9)
Monocytes: 5 %
Neutrophils Absolute: 5.5 10*3/uL (ref 1.4–7.0)
Neutrophils: 53 %
RBC: 5.56 x10E6/uL (ref 4.14–5.80)
RDW: 12.8 % (ref 11.6–15.4)
WBC: 10.1 10*3/uL (ref 3.4–10.8)

## 2023-02-25 LAB — COMP PANEL: LEUKEMIA/LYMPHOMA

## 2023-02-25 LAB — SPECIMEN STATUS REPORT

## 2023-03-07 ENCOUNTER — Ambulatory Visit (INDEPENDENT_AMBULATORY_CARE_PROVIDER_SITE_OTHER): Payer: BC Managed Care – PPO | Admitting: Urology

## 2023-03-07 ENCOUNTER — Encounter: Payer: Self-pay | Admitting: Urology

## 2023-03-07 VITALS — BP 120/80 | HR 72

## 2023-03-07 DIAGNOSIS — Z3009 Encounter for other general counseling and advice on contraception: Secondary | ICD-10-CM | POA: Diagnosis not present

## 2023-03-07 MED ORDER — DIAZEPAM 10 MG PO TABS: 10.0000 mg | ORAL_TABLET | Freq: Once | ORAL | 0 refills | Status: AC

## 2023-03-07 NOTE — Patient Instructions (Signed)
Vasectomy Vasectomy is a procedure in which the vas deferens is cut and then tied or burned (cauterized). The vas deferens is a tube that carries sperm from the testicle to the part of the body that drains urine from the bladder (urethra). This procedure blocks sperm from going through the vas deferens and penis during ejaculation. This ensures that sperm does not go into the vagina during sex. Vasectomy does not affect sexual desire or performance and does not prevent sexually transmitted infections. Vasectomy is considered a permanent and very effective form of birth control (contraception). The decision to have a vasectomy should not be made during a stressful time, such as after the loss of a pregnancy or a divorce. You and your partner should decide on whether to have a vasectomy when you are sure that you do not want children in the future. Tell a health care provider about: Any allergies you have. All medicines you are taking, including vitamins, herbs, eye drops, creams, and over-the-counter medicines. Any problems you or family members have had with anesthetic medicines. Any blood disorders you have. Any surgeries you have had. Any medical conditions you have. What are the risks? Generally, this is a safe procedure. However, problems may occur, including: Infection. Bleeding and swelling of the scrotum. The scrotum is the sac that contains the testicles, blood vessels, and structures that help deliver sperm and semen. Allergic reactions to medicines. Failure of the procedure to prevent pregnancy. There is a very small chance that the tied or cauterized ends of the vas deferens may reconnect (recanalization). If this happens, you could still make a woman pregnant. Pain in the scrotum that continues after you heal from the procedure. What happens before the procedure? Medicines Ask your health care provider about: Changing or stopping your regular medicines. This is especially important if  you are taking diabetes medicines or blood thinners. Taking medicines such as aspirin and ibuprofen. These medicines can thin your blood. Do not take these medicines unless your health care provider tells you to take them. Taking over-the-counter medicines, vitamins, herbs, and supplements. You may be told to take a medicine to help you relax (sedative) a few hours before the procedure. General instructions Do not use any products that contain nicotine or tobacco for at least 4 weeks before the procedure. These products include cigarettes, e-cigarettes, and chewing tobacco. If you need help quitting, ask your health care provider. Plan to have a responsible adult take you home from the hospital or clinic. If you will be going home right after the procedure, plan to have a responsible adult care for you for the time you are told. This is important. Ask your health care provider: How your surgery site will be marked. What steps will be taken to help prevent infection. These steps may include: Removing hair at the surgery site. Washing skin with a germ-killing soap. Taking antibiotic medicine. What happens during the procedure?  You will be given one or more of the following: A sedative, unless you were told to take this a few hours before the procedure. A medicine to numb the area (local anesthetic). Your health care provider will feel, or palpate, for your vas deferens. To reach the vas deferens, one of two methods may be used: A very small incision may be made in your scrotum. A punctured opening may be made in your scrotum, without an incision. Your vas deferens will be pulled out of your scrotum and cut. Then, the vas deferens will be closed   in one of two ways: Tied at the ends. Cauterized at the ends to seal them off. The vas deferens will be put back into your scrotum. The incision or puncture opening will be closed with absorbable stitches (sutures). The sutures will eventually  dissolve and will not need to be removed after the procedure. The procedure will be repeated on the other side of your scrotum. The procedure may vary among health care providers and hospitals. What happens after the procedure? You will be monitored to make sure that you do not have problems. You will be asked not to ejaculate for at least 1 week after the procedure, or for as long as you are told. You will need to use a different form of contraception for 2-4 months after the procedure, until you have test results confirming that there are no sperm in your semen. You may be given scrotal support to wear, such as a jockstrap or underwear with a supportive pouch. If you were given a sedative during the procedure, it can affect you for several hours. Do not drive or operate machinery until your health care provider says that it is safe. Summary Vasectomy blocks sperm from being released during ejaculation. This procedure is considered a permanent and very effective form of birth control. Your scrotum will be numbed with medicine (local anesthetic) for the procedure. After the procedure, you will be asked not to ejaculate for at least 1 week, or for as long as you are told. You will also need to use a different form of contraception until your test results confirm that there are no sperm in your semen. This information is not intended to replace advice given to you by your health care provider. Make sure you discuss any questions you have with your health care provider. Document Revised: 01/17/2020 Document Reviewed: 01/17/2020 Elsevier Patient Education  2024 [employer] Inc[employer]  

## 2023-03-07 NOTE — Progress Notes (Signed)
03/07/2023 9:28 AM   [name] 04-Apr-1981 [id number]  Referring provider: Billie Lade, MD [address] [place],  [place] [address]  Desires sterilization   HPI: [name] is a 41yo here for evaluation of vasectomy. He has 9 children. No scrotal surgeries. No hx of prostatitis.    PMH: Past Medical History:  Diagnosis Date   C. difficile colitis    Diverticulitis     Surgical History: Past Surgical History:  Procedure Laterality Date   APPENDECTOMY     SHOULDER OPEN ROTATOR CUFF REPAIR Left    TONSILLECTOMY      Home Medications:  Allergies as of 03/07/2023   No Known Allergies      Medication List        Accurate as of March 07, 2023  9:28 AM. If you have any questions, ask your nurse or doctor.          fluticasone 50 MCG/ACT nasal spray Commonly known as: FLONASE Place 2 sprays into both nostrils daily.   hydrOXYzine 25 MG capsule Commonly known as: VISTARIL Take 1 capsule (25 mg total) by mouth every 8 (eight) hours as needed.   sertraline 50 MG tablet Commonly known as: ZOLOFT Take 1 tablet (50 mg total) by mouth daily.        Allergies: No Known Allergies  Family History: Family History  Problem Relation Age of Onset   Healthy Mother    Healthy Father    Cancer Paternal Grandmother     Social History:  reports that he quit smoking about 5 years ago. His smoking use included cigarettes. He started smoking about 27 years ago. He smoked an average of .2 packs per day. He has never used smokeless tobacco. He reports that he does not drink alcohol and does not use drugs.  ROS: All other review of systems were reviewed and are negative except what is noted above in HPI  Physical Exam: BP 120/80   Pulse 72   Constitutional:  Alert and oriented, No acute distress. HEENT:  AT, moist mucus membranes.  Trachea midline, no masses. Cardiovascular: No clubbing, cyanosis, or edema. Respiratory: Normal respiratory effort, no  increased work of breathing. GI: Abdomen is soft, nontender, nondistended, no abdominal masses GU: No CVA tenderness. Circumcised phallus. No masses/lesions on penis, testis, scrotum. Bilateral vas deferns palpable Lymph: No cervical or inguinal lymphadenopathy. Skin: No rashes, bruises or suspicious lesions. Neurologic: Grossly intact, no focal deficits, moving all 4 extremities. Psychiatric: Normal mood and affect.  Laboratory Data: Lab Results  Component Value Date   WBC 9.7 02/21/2023   WBC 10.1 02/21/2023   HGB 16.9 02/21/2023   HGB 17.0 02/21/2023   HCT 49.5 02/21/2023   HCT 51.5 (H) 02/21/2023   MCV 90 02/21/2023   MCV 93 02/21/2023   PLT 310 02/21/2023   PLT 324 02/21/2023    Lab Results  Component Value Date   CREATININE 0.91 01/19/2023    No results found for: "PSA"  No results found for: "TESTOSTERONE"  Lab Results  Component Value Date   HGBA1C 5.4 01/19/2023    Urinalysis    Component Value Date/Time   COLORURINE YELLOW 01/13/2020 1839   APPEARANCEUR CLEAR 01/13/2020 1839   LABSPEC 1.026 01/13/2020 1839   PHURINE 5.0 01/13/2020 1839   GLUCOSEU NEGATIVE 01/13/2020 1839   HGBUR SMALL (A) 01/13/2020 1839   BILIRUBINUR NEGATIVE 01/13/2020 1839   KETONESUR NEGATIVE 01/13/2020 1839   PROTEINUR NEGATIVE 01/13/2020 1839  NITRITE NEGATIVE 01/13/2020 1839   LEUKOCYTESUR NEGATIVE 01/13/2020 1839    Lab Results  Component Value Date   BACTERIA NONE SEEN 01/13/2020    Pertinent Imaging:  No results found for this or any previous visit.  No results found for this or any previous visit.  No results found for this or any previous visit.  No results found for this or any previous visit.  No results found for this or any previous visit.  No valid procedures specified. No results found for this or any previous visit.  Results for orders placed during the hospital encounter of 06/04/20  CT Renal Stone Study  Narrative CLINICAL DATA:  Flank pain  with kidney stone suspected.  EXAM: CT ABDOMEN AND PELVIS WITHOUT CONTRAST  TECHNIQUE: Multidetector CT imaging of the abdomen and pelvis was performed following the standard protocol without IV contrast.  COMPARISON:  01/13/2020  FINDINGS: Lower chest: Small posterior outpouching at the GE junction without internal gas. This could reflect a collapsed juxta phrenic diverticulum or solid mass lesion (such as gist) 2 cm in maximal span (craniocaudal).  Hepatobiliary: No focal liver abnormality.No evidence of biliary obstruction or stone.  Pancreas: Unremarkable.  Spleen: Unremarkable.  Adrenals/Urinary Tract: Negative adrenals. No hydronephrosis or stone. Unremarkable bladder.  Stomach/Bowel: Fat stranding around a mildly indistinct distal descending diverticulum. Left colonic diverticulosis is overall mild. Appendectomy.  Vascular/Lymphatic: No acute vascular abnormality. No mass or adenopathy.  Reproductive:No pathologic findings.  Other: No ascites or pneumoperitoneum.  Musculoskeletal: No acute abnormalities. 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 L4-5 grade 1 anterolisthesis  IMPRESSION: 1. Mild and uncomplicated diverticulitis at the descending colon. 2. 2 cm exophytic nodule posteriorly from the lower esophagus which could reflect a collapsed juxtaphrenic diverticulum or soft tissue mass such is GIST, favor soft tissue mass. Recommend GI follow-up. 3. 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   Electronically Signed By: Marnee Spring M.D. On: 06/04/2020 06:02   Assessment & Plan:    1. Vasectomy evaluation Schedule for vasectomy   No follow-ups on file.  Wilkie Aye, MD  [facility] [place]

## 2023-03-25 ENCOUNTER — Encounter: Payer: Self-pay | Admitting: Internal Medicine

## 2023-03-25 NOTE — Telephone Encounter (Signed)
Spoke with patient.

## 2023-03-25 NOTE — Telephone Encounter (Signed)
Patient called in regard to Mychart messages and wants a call back in regard. Is concerned of clotting

## 2023-03-29 ENCOUNTER — Ambulatory Visit (INDEPENDENT_AMBULATORY_CARE_PROVIDER_SITE_OTHER): Payer: BC Managed Care – PPO | Admitting: Family Medicine

## 2023-03-29 ENCOUNTER — Encounter: Payer: Self-pay | Admitting: Family Medicine

## 2023-03-29 VITALS — BP 124/82 | HR 74 | Ht 66.0 in | Wt 225.0 lb

## 2023-03-29 DIAGNOSIS — R1084 Generalized abdominal pain: Secondary | ICD-10-CM | POA: Diagnosis not present

## 2023-03-29 DIAGNOSIS — G609 Hereditary and idiopathic neuropathy, unspecified: Secondary | ICD-10-CM | POA: Diagnosis not present

## 2023-03-29 DIAGNOSIS — S301XXA Contusion of abdominal wall, initial encounter: Secondary | ICD-10-CM | POA: Diagnosis not present

## 2023-03-29 DIAGNOSIS — R200 Anesthesia of skin: Secondary | ICD-10-CM | POA: Diagnosis not present

## 2023-03-29 MED ORDER — GABAPENTIN 100 MG PO CAPS
100.0000 mg | ORAL_CAPSULE | Freq: Three times a day (TID) | ORAL | 0 refills | Status: AC | PRN
Start: 2023-03-29 — End: ?

## 2023-03-29 NOTE — Progress Notes (Unsigned)
Patient Office Visit   Subjective   Patient ID: [name], male    DOB: 02-Jul-1981  Age: 42 y.o. MRN: [id number]  CC:  Chief Complaint  Patient presents with   Bleeding/Bruising    Patient complains of abdominal bruising starting last week, fingers tingling for several months.     HPI YANN BIEHN 42 year old male, presents to the clinic for abdominal pain and bruising around his umbilical area starting last week. He  has a past medical history of C. difficile colitis and Diverticulitis.  Abdominal Pain This is a new problem. The onset quality is gradual. Abdominal discomfort  occurs intermittently and  has been waxing and waning since onset. The pain is located in the generalized abdominal region. The pain is at a severity of 5/10. The quality of the pain is aching with umbilical bruising. The abdominal pain does not radiate. Pertinent negatives include no constipation, diarrhea, fever, hematochezia, nausea or vomiting. Nothing aggravates the pain. The pain is relieved by Nothing. He has tried nothing for the symptoms.        Outpatient Encounter Medications as of 03/29/2023  Medication Sig   diazepam (VALIUM) 10 MG tablet Take 10 mg by mouth daily as needed.   fluticasone (FLONASE) 50 MCG/ACT nasal spray Place 2 sprays into both nostrils daily.   gabapentin (NEURONTIN) 100 MG capsule Take 1 capsule (100 mg total) by mouth 3 (three) times daily as needed.   hydrOXYzine (VISTARIL) 25 MG capsule Take 1 capsule (25 mg total) by mouth every 8 (eight) hours as needed.   sertraline (ZOLOFT) 50 MG tablet Take 1 tablet (50 mg total) by mouth daily.   No facility-administered encounter medications on file as of 03/29/2023.    Past Surgical History:  Procedure Laterality Date   APPENDECTOMY     SHOULDER OPEN ROTATOR CUFF REPAIR Left    TONSILLECTOMY      Review of Systems  Constitutional:  Negative for chills and fever.  Respiratory:  Negative for shortness of breath.    Cardiovascular:  Negative for chest pain.  Gastrointestinal:  Positive for abdominal pain. Negative for blood in stool, constipation, diarrhea, heartburn, melena, nausea and vomiting.  Neurological:  Positive for tingling.      Objective    BP 124/82   Pulse 74   Ht 5\' 6"  (1.676 m)   Wt 225 lb (102.1 kg)   SpO2 97%   BMI 36.32 kg/m   Physical Exam Vitals reviewed.  Constitutional:      General: He is not in acute distress.    Appearance: Normal appearance. He is not ill-appearing, toxic-appearing or diaphoretic.  HENT:     Head: Normocephalic.  Eyes:     General:        Right eye: No discharge.        Left eye: No discharge.     Conjunctiva/sclera: Conjunctivae normal.  Cardiovascular:     Rate and Rhythm: Normal rate.     Pulses: Normal pulses.     Heart sounds: Normal heart sounds.  Pulmonary:     Effort: Pulmonary effort is normal. No respiratory distress.     Breath sounds: Normal breath sounds.  Abdominal:     General: Bowel sounds are normal. There is no distension.     Palpations: Abdomen is soft. There is no mass.     Tenderness: There is no abdominal tenderness. There is no right CVA tenderness, left CVA tenderness or guarding.  Musculoskeletal:  General: Normal range of motion.     Cervical back: Normal range of motion.  Skin:    General: Skin is warm and dry.     Capillary Refill: Capillary refill takes less than 2 seconds.  Neurological:     General: No focal deficit present.     Mental Status: He is alert and oriented to person, place, and time.     Coordination: Coordination normal.     Gait: Gait normal.  Psychiatric:        Mood and Affect: Mood normal.        Behavior: Behavior normal.       Assessment & Plan:  Contusion of abdominal wall, initial encounter -     CBC with Differential/Platelet -     CT ABDOMEN PELVIS WO CONTRAST; Future  Idiopathic peripheral neuropathy -     Basic metabolic panel  Generalized abdominal  pain Assessment & Plan: Abdominal pain with umbilical bruising noted - Unknown etiology CBC and CT abdomen pelvis ordered for further evaluation   Bilateral hand numbness Assessment & Plan: Patient reported pain and tingling sensation constantly now worsening. Trial on Gabapentin 100 mg PRN   Other orders -     Gabapentin; Take 1 capsule (100 mg total) by mouth 3 (three) times daily as needed.  Dispense: 90 capsule; Refill: 0    Return if symptoms worsen or fail to improve.   Cruzita Lederer Newman Nip, FNP

## 2023-03-29 NOTE — Patient Instructions (Signed)

## 2023-03-30 DIAGNOSIS — R109 Unspecified abdominal pain: Secondary | ICD-10-CM | POA: Insufficient documentation

## 2023-03-30 NOTE — Assessment & Plan Note (Signed)
Patient reported pain and tingling sensation constantly now worsening. Trial on Gabapentin 100 mg PRN

## 2023-03-30 NOTE — Assessment & Plan Note (Signed)
Abdominal pain with umbilical bruising noted - Unknown etiology CBC and CT abdomen pelvis ordered for further evaluation

## 2023-03-31 ENCOUNTER — Telehealth: Payer: Self-pay | Admitting: Family Medicine

## 2023-03-31 DIAGNOSIS — S301XXA Contusion of abdominal wall, initial encounter: Secondary | ICD-10-CM | POA: Diagnosis not present

## 2023-03-31 DIAGNOSIS — G609 Hereditary and idiopathic neuropathy, unspecified: Secondary | ICD-10-CM | POA: Diagnosis not present

## 2023-03-31 NOTE — Telephone Encounter (Signed)
Spoke with patient.

## 2023-03-31 NOTE — Telephone Encounter (Signed)
I ordered without dye, it is not medically necessary with dye.  Thank you

## 2023-03-31 NOTE — Telephone Encounter (Signed)
It is without dye

## 2023-03-31 NOTE — Telephone Encounter (Signed)
Patient came by the office to speak to nurse or provider.  He asked for a phone, he said the provider asked him about doing CT scan with dye and all orders says without dye.  He asked for nurse or provider to give him a call back to explain why without dye.  His CT scan is scheduled for Monday, 07.22.2024

## 2023-04-01 LAB — BASIC METABOLIC PANEL
BUN/Creatinine Ratio: 15 (ref 9–20)
BUN: 13 mg/dL (ref 6–24)
CO2: 22 mmol/L (ref 20–29)
Calcium: 9.2 mg/dL (ref 8.7–10.2)
Chloride: 103 mmol/L (ref 96–106)
Creatinine, Ser: 0.84 mg/dL (ref 0.76–1.27)
Glucose: 90 mg/dL (ref 70–99)
Potassium: 4.5 mmol/L (ref 3.5–5.2)
Sodium: 141 mmol/L (ref 134–144)
eGFR: 112 mL/min/{1.73_m2} (ref >59)

## 2023-04-01 LAB — CBC WITH DIFFERENTIAL/PLATELET
Basophils Absolute: 0.1 10*3/uL (ref 0.0–0.2)
Basos: 1 %
EOS (ABSOLUTE): 0.4 10*3/uL (ref 0.0–0.4)
Eos: 4 %
Hematocrit: 51.9 % — ABNORMAL HIGH (ref 37.5–51.0)
Hemoglobin: 17.1 g/dL (ref 13.0–17.7)
Immature Grans (Abs): 0 10*3/uL (ref 0.0–0.1)
Immature Granulocytes: 0 %
Lymphocytes Absolute: 3.7 10*3/uL — ABNORMAL HIGH (ref 0.7–3.1)
Lymphs: 44 %
MCH: 30.6 pg (ref 26.6–33.0)
MCHC: 32.9 g/dL (ref 31.5–35.7)
MCV: 93 fL (ref 79–97)
Monocytes Absolute: 0.5 10*3/uL (ref 0.1–0.9)
Monocytes: 5 %
Neutrophils Absolute: 3.8 10*3/uL (ref 1.4–7.0)
Neutrophils: 46 %
Platelets: 281 10*3/uL (ref 150–450)
RBC: 5.58 x10E6/uL (ref 4.14–5.80)
RDW: 13.1 % (ref 11.6–15.4)
WBC: 8.4 10*3/uL (ref 3.4–10.8)

## 2023-04-04 ENCOUNTER — Encounter: Payer: Self-pay | Admitting: Internal Medicine

## 2023-04-04 ENCOUNTER — Ambulatory Visit (HOSPITAL_COMMUNITY)
Admission: RE | Admit: 2023-04-04 | Discharge: 2023-04-04 | Disposition: A | Discharge status: Home / Self Care | Payer: BC Managed Care – PPO | Source: Ambulatory Visit | Attending: Family Medicine | Admitting: Family Medicine

## 2023-04-04 DIAGNOSIS — R109 Unspecified abdominal pain: Secondary | ICD-10-CM | POA: Diagnosis not present

## 2023-04-04 DIAGNOSIS — K5730 Diverticulosis of large intestine without perforation or abscess without bleeding: Secondary | ICD-10-CM | POA: Diagnosis not present

## 2023-04-04 DIAGNOSIS — S301XXA Contusion of abdominal wall, initial encounter: Secondary | ICD-10-CM | POA: Diagnosis not present

## 2023-04-05 ENCOUNTER — Other Ambulatory Visit: Payer: Self-pay

## 2023-04-05 DIAGNOSIS — D72820 Lymphocytosis (symptomatic): Secondary | ICD-10-CM

## 2023-04-06 ENCOUNTER — Telehealth: Payer: Self-pay | Admitting: Internal Medicine

## 2023-04-06 NOTE — Telephone Encounter (Signed)
I have not received any results.  

## 2023-04-06 NOTE — Telephone Encounter (Signed)
Pt called in for CT results, wants a call back when results are in.

## 2023-04-07 NOTE — Telephone Encounter (Signed)
Patient aware results are not back and will have results expedited.

## 2023-04-25 DIAGNOSIS — D72820 Lymphocytosis (symptomatic): Secondary | ICD-10-CM | POA: Insufficient documentation

## 2023-04-26 ENCOUNTER — Inpatient Hospital Stay: Payer: BC Managed Care – PPO | Attending: Hematology | Admitting: Hematology

## 2023-04-26 ENCOUNTER — Inpatient Hospital Stay: Payer: BC Managed Care – PPO

## 2023-04-26 VITALS — BP 122/88 | HR 80 | Temp 98.0°F | Resp 18 | Ht 66.0 in | Wt 228.8 lb

## 2023-04-26 DIAGNOSIS — Z79899 Other long term (current) drug therapy: Secondary | ICD-10-CM | POA: Insufficient documentation

## 2023-04-26 DIAGNOSIS — L732 Hidradenitis suppurativa: Secondary | ICD-10-CM | POA: Insufficient documentation

## 2023-04-26 DIAGNOSIS — R519 Headache, unspecified: Secondary | ICD-10-CM | POA: Insufficient documentation

## 2023-04-26 DIAGNOSIS — D72820 Lymphocytosis (symptomatic): Secondary | ICD-10-CM | POA: Insufficient documentation

## 2023-04-26 DIAGNOSIS — R202 Paresthesia of skin: Secondary | ICD-10-CM | POA: Insufficient documentation

## 2023-04-26 DIAGNOSIS — R7989 Other specified abnormal findings of blood chemistry: Secondary | ICD-10-CM | POA: Diagnosis not present

## 2023-04-26 DIAGNOSIS — R5383 Other fatigue: Secondary | ICD-10-CM | POA: Diagnosis not present

## 2023-04-26 DIAGNOSIS — R6889 Other general symptoms and signs: Secondary | ICD-10-CM | POA: Diagnosis not present

## 2023-04-26 LAB — CBC WITH DIFFERENTIAL/PLATELET
Abs Immature Granulocytes: 0.04 10*3/uL (ref 0.00–0.07)
Basophils Absolute: 0 10*3/uL (ref 0.0–0.1)
Basophils Relative: 1 %
Eosinophils Absolute: 0.2 10*3/uL (ref 0.0–0.5)
Eosinophils Relative: 3 %
HCT: 50.1 % (ref 39.0–52.0)
Hemoglobin: 17 g/dL (ref 13.0–17.0)
Immature Granulocytes: 1 %
Lymphocytes Relative: 41 %
Lymphs Abs: 3.1 10*3/uL (ref 0.7–4.0)
MCH: 30.7 pg (ref 26.0–34.0)
MCHC: 33.9 g/dL (ref 30.0–36.0)
MCV: 90.6 fL (ref 80.0–100.0)
Monocytes Absolute: 0.6 10*3/uL (ref 0.1–1.0)
Monocytes Relative: 8 %
Neutro Abs: 3.5 10*3/uL (ref 1.7–7.7)
Neutrophils Relative %: 46 %
Platelets: 260 10*3/uL (ref 150–400)
RBC: 5.53 MIL/uL (ref 4.22–5.81)
RDW: 13.4 % (ref 11.5–15.5)
WBC: 7.6 10*3/uL (ref 4.0–10.5)
nRBC: 0 % (ref 0.0–0.2)
nRBC: 0 /100 WBC

## 2023-04-26 LAB — SEDIMENTATION RATE: Sed Rate: 1 mm/hr (ref 0–16)

## 2023-04-26 LAB — HEPATITIS C ANTIBODY: HCV Ab: NONREACTIVE

## 2023-04-26 LAB — HEPATITIS B SURFACE ANTIBODY,QUALITATIVE: Hep B S Ab: NONREACTIVE

## 2023-04-26 LAB — SAVE SMEAR(SSMR), FOR PROVIDER SLIDE REVIEW

## 2023-04-26 LAB — C-REACTIVE PROTEIN: CRP: 1.6 mg/dL — ABNORMAL HIGH (ref <1.0)

## 2023-04-26 LAB — HIV ANTIBODY (ROUTINE TESTING W REFLEX): HIV Screen 4th Generation wRfx: NONREACTIVE

## 2023-04-26 LAB — HEPATITIS B SURFACE ANTIGEN: Hepatitis B Surface Ag: NONREACTIVE

## 2023-04-26 LAB — HEPATITIS B CORE ANTIBODY, TOTAL: Hep B Core Total Ab: NONREACTIVE

## 2023-04-26 LAB — LACTATE DEHYDROGENASE: LDH: 109 U/L (ref 98–192)

## 2023-04-26 NOTE — Patient Instructions (Signed)
You were seen and examined today by Dr. Ellin Saba. Dr. Ellin Saba is a hematologist, meaning that he specializes in blood abnormalities. Dr. Ellin Saba discussed your past medical history, family history of cancers/blood conditions and the events that led to you being here today.  You were referred to Dr. Ellin Saba due to lymphocytosis (elevated lymphocyte count).  Dr. Ellin Saba has recommended additional labs today for further evaluation.  Follow-up as scheduled.

## 2023-04-26 NOTE — Progress Notes (Signed)
CONSULT NOTE  Patient Care Team: Billie Lade, MD as PCP - General (Internal Medicine)  CHIEF COMPLAINTS/PURPOSE OF CONSULTATION:  LGL lymphocytosis.  HISTORY OF PRESENTING ILLNESS:  [name] 42 y.o. male is seen at the request of Dr. 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  He had elevated absolute lymphocyte count 4 few times since May 2024.  Flow cytometry did not show any monoclonal B-cell population but it showed CD57 positive, CD3 positive, CD8 positive large granular lymphocytes.  No increase in blasts were seen.  He denies any fevers or weight loss.  He has few night sweats, once per month for the past few months.  He reports more frequent headaches, occasional tiredness and trouble sleeping which is new.  He also reports tingling in the hands left more than right.  Complains of dry eyes and mouth.    MEDICAL HISTORY:  Past Medical History:  Diagnosis Date   C. difficile colitis    Diverticulitis     SURGICAL HISTORY: Past Surgical History:  Procedure Laterality Date   APPENDECTOMY     SHOULDER OPEN ROTATOR CUFF REPAIR Left    TONSILLECTOMY      SOCIAL HISTORY: Social History   Socioeconomic History   Marital status: Single    Spouse name: Not on file   Number of children: 9   Years of education: Not on file   Highest education level: Not on file  Occupational History   Not on file  Tobacco Use   Smoking status: Former    Current packs/day: 0.00    Average packs/day: 0.2 packs/day for 22.0 years (4.4 ttl pk-yrs)    Types: Cigarettes    Start date: 35    Quit date: 2019    Years since quitting: 5.6   Smokeless tobacco: Never  Substance and Sexual Activity   Alcohol use: No    Comment: occasional at most   Drug use: No   Sexual activity: Yes    Partners: Male  Other Topics Concern   Not on file  Social History Narrative   Not on file   Social Determinants of Health   Financial Resource Strain: Not on file  Food  Insecurity: No Food Insecurity (04/26/2023)   Hunger Vital Sign    Worried About Running Out of Food in the Last Year: Never true    Ran Out of Food in the Last Year: Never true  Transportation Needs: No Transportation Needs (04/26/2023)   PRAPARE - Administrator, Civil Service (Medical): No    Lack of Transportation (Non-Medical): No  Physical Activity: Not on file  Stress: Not on file  Social Connections: Unknown (01/20/2022)   [facility] from [facility]   Social Network    Social Network: Not on file  Intimate Partner Violence: Not At Risk (04/26/2023)   Humiliation, Afraid, Rape, and Kick questionnaire    Fear of Current or Ex-Partner: No    Emotionally Abused: No    Physically Abused: No    Sexually Abused: No    FAMILY HISTORY: Family History  Problem Relation Age of Onset   Healthy Mother    Healthy Father    Cancer Paternal Grandmother     ALLERGIES:  has No Known Allergies.  MEDICATIONS:  Current Outpatient Medications  Medication Sig Dispense Refill   diazepam (VALIUM) 10 MG tablet Take 10 mg by mouth daily as needed.     fluticasone (FLONASE) 50 MCG/ACT nasal spray Place 2  sprays into both nostrils daily. 16 g 6   gabapentin (NEURONTIN) 100 MG capsule Take 1 capsule (100 mg total) by mouth 3 (three) times daily as needed. 90 capsule 0   hydrOXYzine (VISTARIL) 25 MG capsule Take 1 capsule (25 mg total) by mouth every 8 (eight) hours as needed. 30 capsule 0   sertraline (ZOLOFT) 50 MG tablet Take 1 tablet (50 mg total) by mouth daily. 30 tablet 3   No current facility-administered medications for this visit.    REVIEW OF SYSTEMS:   Constitutional: Denies fevers, chills or abnormal night sweats Eyes: Denies blurriness of vision, double vision or watery eyes Ears, nose, mouth, throat, and face: Denies mucositis or sore throat Respiratory: Positive for dyspnea at times. Cardiovascular: Denies palpitation, chest discomfort or lower  extremity swelling Gastrointestinal:  Denies nausea, heartburn or change in bowel habits Skin: Denies abnormal skin rashes Lymphatics: Denies new lymphadenopathy or easy bruising Neurological: Denies weakness.  Positive for tingling in the hands. Behavioral/Psych: Mood is stable, no new changes  All other systems were reviewed with the patient and are negative.  PHYSICAL EXAMINATION: ECOG PERFORMANCE STATUS: 0 - Asymptomatic  Vitals:   04/26/23 0813  BP: 122/88  Pulse: 80  Resp: 18  Temp: 98 F (36.7 C)  SpO2: 95%   Filed Weights   04/26/23 0813  Weight: 228 lb 12.8 oz (103.8 kg)    GENERAL:alert, no distress and comfortable SKIN: skin color, texture, turgor are normal, no rashes or significant lesions EYES: normal, conjunctiva are pink and non-injected, sclera clear OROPHARYNX:no exudate, no erythema and lips, buccal mucosa, and tongue normal  NECK: supple, thyroid normal size, non-tender, without nodularity LYMPH:  no palpable lymphadenopathy in the cervical, axillary or inguinal LUNGS: clear to auscultation and percussion with normal breathing effort HEART: regular rate & rhythm and no murmurs and no lower extremity edema ABDOMEN:abdomen soft, non-tender and normal bowel sounds Musculoskeletal:no cyanosis of digits and no clubbing  PSYCH: alert & oriented x 3 with fluent speech NEURO: no focal motor/sensory deficits  LABORATORY DATA:  I have reviewed the data as listed Recent Results (from the past 2160 hour(s))  CBC with Differential     Status: Abnormal   Collection Time: 02/15/23 10:16 AM  Result Value Ref Range   WBC 9.5 3.4 - 10.8 x10E3/uL   RBC 5.60 4.14 - 5.80 x10E6/uL   Hemoglobin 16.7 13.0 - 17.7 g/dL   Hematocrit 16.6 06.3 - 51.0 %   MCV 89 79 - 97 fL   MCH 29.8 26.6 - 33.0 pg   MCHC 33.4 31.5 - 35.7 g/dL   RDW 01.6 01.0 - 93.2 %   Platelets 307 150 - 450 x10E3/uL   Neutrophils 52 Not Estab. %   Lymphs 39 Not Estab. %   Monocytes 5 Not Estab. %    Eos 3 Not Estab. %   Basos 1 Not Estab. %   Neutrophils Absolute 4.9 1.4 - 7.0 x10E3/uL   Lymphocytes Absolute 3.7 (H) 0.7 - 3.1 x10E3/uL   Monocytes Absolute 0.5 0.1 - 0.9 x10E3/uL   EOS (ABSOLUTE) 0.3 0.0 - 0.4 x10E3/uL   Basophils Absolute 0.1 0.0 - 0.2 x10E3/uL   Immature Granulocytes 0 Not Estab. %   Immature Grans (Abs) 0.0 0.0 - 0.1 x10E3/uL  CBC with Differential     Status: Abnormal   Collection Time: 02/21/23 10:09 AM  Result Value Ref Range   WBC 9.7 3.4 - 10.8 x10E3/uL   RBC 5.49 4.14 - 5.80 x10E6/uL  Hemoglobin 16.9 13.0 - 17.7 g/dL   Hematocrit 84.6 96.2 - 51.0 %   MCV 90 79 - 97 fL   MCH 30.8 26.6 - 33.0 pg   MCHC 34.1 31.5 - 35.7 g/dL   RDW 95.2 84.1 - 32.4 %   Platelets 310 150 - 450 x10E3/uL   Neutrophils 54 Not Estab. %   Lymphs 38 Not Estab. %   Monocytes 5 Not Estab. %   Eos 3 Not Estab. %   Basos 0 Not Estab. %   Neutrophils Absolute 5.2 1.4 - 7.0 x10E3/uL   Lymphocytes Absolute 3.7 (H) 0.7 - 3.1 x10E3/uL   Monocytes Absolute 0.5 0.1 - 0.9 x10E3/uL   EOS (ABSOLUTE) 0.3 0.0 - 0.4 x10E3/uL   Basophils Absolute 0.0 0.0 - 0.2 x10E3/uL   Immature Granulocytes 0 Not Estab. %   Immature Grans (Abs) 0.0 0.0 - 0.1 x10E3/uL  Comp panel: Leukemia/Lymphoma     Status: None   Collection Time: 02/21/23 10:09 AM  Result Value Ref Range   PATH INTERP XXX-IMP Comment     Comment: 1)  No diagnostic immunophenotypic abnormality detected 2)  Absolute lymphocytosis, see comment    ANNOTATION COMMENT IMP Comment     Comment: The lymphocytosis is due to an absolute increase in total T cells and CD8+ T cells. No definitive immunophenotypic evidence of an aberrant T cell population is detected. Recommend clinical correlation and follow up as appropriate.    CLINICAL INFO Comment     Comment: Lymphocytosis Accompanying CBC dated 02/15/2023 shows: WBC count 9.5, Hgb 16.7, Neu 4.9, Lym 3.7, Mon 0.5.    Specimen Type Comment     Comment: Peripheral blood   ASSESSMENT OF  LEUKOCYTES Comment     Comment: No monoclonal B cell population is detected. kappa:lambda ratio 1.4 There is no loss of, or aberrant expression of, the pan T cell antigens to suggest a neoplastic T cell process. A decreased CD4/T helper to CD8/T suppressor cell ratio is detected. CD4:CD8 ratio 0.7 CD57 positive cells are increased and are composed of a mixture of few CD4 positive T cells, many CD8 positive T cells, and few NK cells. CD57 is a marker of large granular lymphocytes. The population of CD8+ T-cell LGLs accounts for approximately 8% of overall cellularity. These LGLs are positive for CD3, CD8 and CD57. Clinical correlation is recommended. No circulating blasts are detected. There is no immunophenotypic evidence of abnormal myeloid maturation. Analysis of the leukocyte population shows: granulocytes 65%, monocytes 3%, lymphocytes 32%, blasts <0.1%, B cells 2%, T cells 26%, LGLs 13%, NK cells 4%.    % Viable Cells Comment     Comment: 94%   ANALYSIS AND GATING STRATEGY Comment     Comment: 8 color analysis with CD45/SSC gating   Technical-Analysis performed at [employer]  of [address], [address] [place], [place] [address]  Director: Maurine Simmering, MDPhD    IMMUNOPHENOTYPING STUDY Comment     Comment: CD2       Normal         CD3       Normal CD4       Normal         CD5       Normal CD7       Normal         CD8       Normal CD10      Normal         CD11b  Normal CD13      Normal         CD14      Normal CD16      Normal         CD19      Normal CD20      Normal         CD33      Normal CD34      Normal         CD38      Normal CD45      Normal         CD56      Normal CD57      Normal         CD117     Normal HLA-DR    Normal         KAPPA     Normal LAMBDA    Normal         CD64      Normal    PATHOLOGIST NAME Comment     Comment: Dorita Sciara, M.D.   COMMENT: Comment     Comment: Each antibody in this assay was utilized to assess for  potential abnormalities of studied cell populations or to characterize identified abnormalities. This test was developed and its performance characteristics determined by [facility].  It has not been cleared or approved by the U.S. Food and Drug Administration. The FDA has determined that such clearance or approval is not necessary. This test is used for clinical purposes.  It should not be regarded as investigational or for research.   Pathologist smear review     Status: Abnormal   Collection Time: 02/21/23 10:09 AM  Result Value Ref Range   Path Rev WBC Comment:     Comment: See comments/recommendations   Path Rev RBC Comment:     Comment: See comments/recommendations   Path Rev PLTs Comment:     Comment: See comments/recommendations   PATH INTERP BLD-IMP Comment     Comment: The hematocrit is increased. The total RBC count is high normal suggestive of an emerging mild erythrocytosis. Comment:  Common causes of erythrocytosis include chronic hypoxic conditions (COPD, smoking, obstructive sleep apnea, etc.).  Clinical correlation is essential. Mild absolute lymphocytosis.  Comment: The significance of the mild lymphocytosis is uncertain. It may be reactive. Clinical correlation and repeat CBC is suggested in a few weeks.    PATHOLOGIST NAME Comment     Comment: Reviewed by: Gilman Schmidt, MD, Pathologist   WBC 10.1 3.4 - 10.8 x10E3/uL   RBC 5.56 4.14 - 5.80 x10E6/uL   Hemoglobin 17.0 13.0 - 17.7 g/dL   Hematocrit 09.8 (H) 11.9 - 51.0 %   MCV 93 79 - 97 fL   MCH 30.6 26.6 - 33.0 pg   MCHC 33.0 31.5 - 35.7 g/dL   RDW 14.7 82.9 - 56.2 %   Platelets 324 150 - 450 x10E3/uL   Neutrophils 53 Not Estab. %   Lymphs 37 Not Estab. %   Monocytes 5 Not Estab. %   Eos 4 Not Estab. %   Basos 1 Not Estab. %   Neutrophils Absolute 5.5 1.4 - 7.0 x10E3/uL   Lymphocytes Absolute 3.7 (H) 0.7 - 3.1 x10E3/uL   Monocytes Absolute 0.5 0.1 - 0.9 x10E3/uL   EOS (ABSOLUTE) 0.4 0.0 - 0.4 x10E3/uL    Basophils Absolute 0.1 0.0 - 0.2 x10E3/uL   Immature Granulocytes 0 Not Estab. %   Immature Grans (Abs) 0.0 0.0 -  0.1 x10E3/uL    Comment: **Effective April 11, 2023 profile [id number] Hematopath Consultation,**   Smear will be made non-orderable. The lab will add a Pathologist   Review to abnormal CBCs that require a Pathologist Review based   on the business rules.   Specimen status report     Status: None   Collection Time: 02/21/23 10:09 AM  Result Value Ref Range   specimen status report Comment     Comment: Written Authorization Written Authorization Written Authorization Received. Authorization received from [employer] 02-22-2023 Logged by Adria Dill   CBC with Differential/Platelet     Status: Abnormal   Collection Time: 03/31/23  9:33 AM  Result Value Ref Range   WBC 8.4 3.4 - 10.8 x10E3/uL   RBC 5.58 4.14 - 5.80 x10E6/uL   Hemoglobin 17.1 13.0 - 17.7 g/dL   Hematocrit 65.7 (H) 84.6 - 51.0 %   MCV 93 79 - 97 fL   MCH 30.6 26.6 - 33.0 pg   MCHC 32.9 31.5 - 35.7 g/dL   RDW 96.2 95.2 - 84.1 %   Platelets 281 150 - 450 x10E3/uL   Neutrophils 46 Not Estab. %   Lymphs 44 Not Estab. %   Monocytes 5 Not Estab. %   Eos 4 Not Estab. %   Basos 1 Not Estab. %   Neutrophils Absolute 3.8 1.4 - 7.0 x10E3/uL   Lymphocytes Absolute 3.7 (H) 0.7 - 3.1 x10E3/uL   Monocytes Absolute 0.5 0.1 - 0.9 x10E3/uL   EOS (ABSOLUTE) 0.4 0.0 - 0.4 x10E3/uL   Basophils Absolute 0.1 0.0 - 0.2 x10E3/uL   Immature Granulocytes 0 Not Estab. %   Immature Grans (Abs) 0.0 0.0 - 0.1 x10E3/uL  Basic Metabolic Panel (BMET)     Status: None   Collection Time: 03/31/23  9:33 AM  Result Value Ref Range   Glucose 90 70 - 99 mg/dL   BUN 13 6 - 24 mg/dL   Creatinine, Ser 3.24 0.76 - 1.27 mg/dL   eGFR 401 >02 VO/ZDG/6.44   BUN/Creatinine Ratio 15 9 - 20   Sodium 141 134 - 144 mmol/L   Potassium 4.5 3.5 - 5.2 mmol/L   Chloride 103 96 - 106 mmol/L   CO2 22 20 - 29 mmol/L   Calcium 9.2 8.7 - 10.2  mg/dL  HIV antibody (with reflex)     Status: None   Collection Time: 04/26/23  9:12 AM  Result Value Ref Range   HIV Screen 4th Generation wRfx Non Reactive Non Reactive    Comment: Performed at [facility], [address] [place], [place] [address]  Hepatitis B core antibody, total     Status: None   Collection Time: 04/26/23  9:12 AM  Result Value Ref Range   Hep B Core Total Ab NON REACTIVE NON REACTIVE    Comment: Performed at [facility], [address] [place], [place] [address]  Hepatitis C Antibody     Status: None   Collection Time: 04/26/23  9:12 AM  Result Value Ref Range   HCV Ab NON REACTIVE NON REACTIVE    Comment: (NOTE) Nonreactive HCV antibody screen is consistent with no HCV infections,  unless recent infection is suspected or other evidence exists to indicate HCV infection.  Performed at [facility], [address] [place], [place] [address]   Hepatitis B surface antigen     Status: None   Collection Time: 04/26/23  9:12 AM  Result Value Ref Range  Hepatitis B Surface Ag NON REACTIVE NON REACTIVE    Comment: Performed at [facility], [address], [place], [place] [address]  Hepatitis B surface antibody     Status: None   Collection Time: 04/26/23  9:12 AM  Result Value Ref Range   Hep B S Ab NON REACTIVE NON REACTIVE    Comment: (NOTE) Inconsistent with immunity, less than 10 mIU/mL.  Performed at [facility], [address] [place], [place] [address]   C-reactive protein     Status: Abnormal   Collection Time: 04/26/23  9:12 AM  Result Value Ref Range   CRP 1.6 (H) <1.0 mg/dL    Comment: Performed at [facility], [address], [place], [place] [address]  Sedimentation rate     Status: None   Collection Time: 04/26/23  9:12 AM  Result Value Ref Range   Sed Rate 1 0 - 16 mm/hr    Comment: Performed at [facility], [address], [place], [place] [address]  Lactate dehydrogenase     Status: None    Collection Time: 04/26/23  9:12 AM  Result Value Ref Range   LDH 109 98 - 192 U/L    Comment: Performed at [facility], [address], [place], [place] [address]  Save Smear for Provider Slide Review     Status: None   Collection Time: 04/26/23  9:12 AM  Result Value Ref Range   Smear Review SMEAR STAINED AND AVAILABLE FOR REVIEW     Comment: Performed at [facility], [address], [place], [place] [address]  CBC with Differential     Status: None   Collection Time: 04/26/23  9:12 AM  Result Value Ref Range   WBC 7.6 4.0 - 10.5 K/uL   RBC 5.53 4.22 - 5.81 MIL/uL   Hemoglobin 17.0 13.0 - 17.0 g/dL   HCT 40.3 47.4 - 25.9 %   MCV 90.6 80.0 - 100.0 fL   MCH 30.7 26.0 - 34.0 pg   MCHC 33.9 30.0 - 36.0 g/dL   RDW 56.3 87.5 - 64.3 %   Platelets 260 150 - 400 K/uL   nRBC 0.0 0.0 - 0.2 %   Neutrophils Relative % 46 %   Neutro Abs 3.5 1.7 - 7.7 K/uL   Lymphocytes Relative 41 %   Lymphs Abs 3.1 0.7 - 4.0 K/uL   Monocytes Relative 8 %   Monocytes Absolute 0.6 0.1 - 1.0 K/uL   Eosinophils Relative 3 %   Eosinophils Absolute 0.2 0.0 - 0.5 K/uL   Basophils Relative 1 %   Basophils Absolute 0.0 0.0 - 0.1 K/uL   nRBC 0 0 /100 WBC   Immature Granulocytes 1 %   Abs Immature Granulocytes 0.04 0.00 - 0.07 K/uL    Comment: Performed at [facility], [address], [place], [place] [address]    RADIOGRAPHIC STUDIES: I have personally reviewed the radiological images as listed and agreed with the findings in the report. CT ABDOMEN PELVIS WO CONTRAST  Result Date: 04/07/2023 CLINICAL DATA:  Acute abdominal pain. Upper abdominal pain bruising for 2 weeks. EXAM: CT ABDOMEN AND PELVIS WITHOUT CONTRAST TECHNIQUE: Multidetector CT imaging of the abdomen and pelvis was performed following the standard protocol without IV contrast. RADIATION DOSE REDUCTION: This exam was performed according to the departmental dose-optimization program which includes automated exposure control, adjustment of the mA  and/or kV according to patient size and/or use of iterative reconstruction technique. COMPARISON:  CT  examination dated June 04, 2020 FINDINGS: Lower chest: No acute abnormality. Hepatobiliary: No focal liver abnormality is seen. No gallstones, gallbladder wall thickening, or biliary dilatation. Pancreas: Unremarkable. No pancreatic ductal dilatation or surrounding inflammatory changes. Spleen: Normal in size without focal abnormality. Adrenals/Urinary Tract: Adrenal glands are unremarkable. Kidneys are normal, without renal calculi, focal lesion, or hydronephrosis. Bladder is unremarkable. Stomach/Bowel: Stomach is within normal limits. Postsurgical changes suggesting prior appendectomy. Colonic diverticulosis without evidence of acute diverticulitis. No evidence of bowel wall thickening, distention, or inflammatory changes. Vascular/Lymphatic: No significant vascular findings are present. No enlarged abdominal or pelvic lymph nodes. Reproductive: Prostate is unremarkable. Other: No abdominal wall hernia or abnormality. No abdominopelvic ascites. 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 IMPRESSION: 1. No CT evidence of acute abdominal/pelvic process. 2. Colonic diverticulosis without evidence of acute diverticulitis. 3. 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 Electronically Signed   By: Larose Hires D.O.   On: 04/07/2023 10:38    ASSESSMENT:  1.  Large granular lymphocytosis: - Patient seen at the request of Dr. Durwin Nora. - He has elevated absolute lymphocytosis since May 2024. - Flow cytometry by Dr. Durwin Nora (02/21/2023): No monoclonal B-cell population.  CD57 positive cells are increased and are composed of mixture of CD4 and CD8 positive T cells.  Population of CD8 positive T-cell LGL's account for approximately  8% of cellularity.  These LGL's are positive for CD3, CD8 and CD57.  No circulating blasts. - No fevers or weight loss.  Occasional night sweats once a month for the past few months.  He reports occasional tiredness and trouble sleeping which is new.  More frequent headaches are also noted.  Tingling in the hands left more than right present.  Also complains of dry eyes and mouth.  No joint pains or skin rashes. - Last infection: Diverticulitis in August 2023 when he was hospitalized.  2.  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  No chemical exposure.  Non-smoker. - Paternal grandmother had lupus and lung cancer.  Paternal great uncle has colon cancer.  PLAN:  1.  Large granular lymphocytosis: - I have discussed results of the flow cytometry with the patient in detail. - This could be reactive  lymphocytosis versus LGL leukemia. - Recommended T-cell receptor gene rearrangement. - Will also repeat CBC today and check LDH.  Will check for connective tissue disorders including ANA, rheumatoid factor, anti-Ro and anti-La antibodies. - Will check for infectious etiologies including HTLV antibodies, HIV antibodies, hepatitis B and C. - Will check for plasma cell disorders with SPEP, free light chains and immunofixation. - RTC 4 weeks for follow-up to discuss results.    All questions were answered. The patient knows to call the clinic with any problems, questions or concerns.      Doreatha Massed, MD 04/26/23 4:36 PM

## 2023-05-03 ENCOUNTER — Inpatient Hospital Stay: Payer: BC Managed Care – PPO | Admitting: Hematology

## 2023-05-13 ENCOUNTER — Encounter: Payer: BC Managed Care – PPO | Admitting: Urology

## 2023-05-24 NOTE — Progress Notes (Signed)
[facility] [address] [place] [address]    Clinic Day:  05/25/2023  Referring physician: Billie Lade, MD  Patient Care Team: Billie Lade, MD as PCP - General (Internal Medicine)   ASSESSMENT & PLAN:   Assessment: 1.  Large granular lymphocytosis: - Patient seen at the request of Dr. Durwin Nora. - He has elevated absolute lymphocytosis since May 2024. - Flow cytometry by Dr. Durwin Nora (02/21/2023): No monoclonal B-cell population.  CD57 positive cells are increased and are composed of mixture of CD4 and CD8 positive T cells.  Population of CD8 positive T-cell LGL's account for approximately 8% of cellularity.  These LGL's are positive for CD3, CD8 and CD57.  No circulating blasts. - No fevers or weight loss.  Occasional night sweats once a month for the past few months.  He reports occasional tiredness and trouble sleeping which is new.  More frequent headaches are also noted.  Tingling in the hands left more than right present.  Also complains of dry eyes and mouth.  No joint pains or skin rashes. - Last infection: Diverticulitis in August 2023 when he was hospitalized.   2.  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  No chemical exposure.  Non-smoker. - Paternal grandmother had lupus and lung cancer.  Paternal great uncle has colon cancer.    Plan: 1.  Large granular lymphocytosis: - We have reviewed blood work from 04/26/2023.  Infectious disease workup for hepatitis B and C, HTLV, HIV were negative.  ANA and rheumatoid factor were negative.  Plasma cell disorder workup was negative.  LDH was normal.  CRP was elevated at 1.6. - Patient has recurrent episodes of hidradenitis suppurativa which could be contributing to elevated CRP. - T-cell panel showed a clonal T-cell receptor gamma population detected. - This could be either reactive or represent a evolving clonal process. - He does not have any B symptoms at this  time. - Recommend follow-up in 6 months with repeat CBC, LDH and T-cell receptor gene rearrangement studies.    Orders Placed This Encounter  Procedures   Miscellaneous [facility] test (send-out)    Standing Status:   Future    Standing Expiration Date:   05/24/2024    Order Specific Question:   Test name / description:    Answer:   test # [id number]    Order Specific Question:   Release to patient    Answer:   Immediate   CBC with Differential/Platelet    Standing Status:   Future    Standing Expiration Date:   05/24/2024    Order Specific Question:   Release to patient    Answer:   Immediate   Lactate dehydrogenase    Standing Status:   Future    Standing Expiration Date:   05/24/2024    Order Specific Question:   Release to patient    Answer:   Immediate      I,Katie Daubenspeck,acting as a scribe for Doreatha Massed, MD.,have documented all relevant documentation on the behalf of Doreatha Massed, MD,as directed by  Doreatha Massed, MD while in the presence of Doreatha Massed, MD.   I, Doreatha Massed MD, have reviewed the above documentation for accuracy and completeness, and I agree with the above.   Doreatha Massed, MD   9/11/20245:25 PM  CHIEF COMPLAINT:   Diagnosis: large granular lymphocytosis    Cancer Staging  No matching staging information was found for the patient.  Prior Therapy: None  Current Therapy: Observe patient   HISTORY OF PRESENT ILLNESS:   Oncology History   No history exists.     INTERVAL HISTORY:   [name] is a 42 y.o. male presenting to clinic today for follow up of large granular lymphocytosis. He was last seen by me on 04/26/23 in consultation.  Today, he states that he is doing well overall. His appetite level is at 75%. His energy level is at 60%.  PAST MEDICAL HISTORY:   Past Medical History: Past Medical History:  Diagnosis Date   C. difficile colitis    Diverticulitis     Surgical History: Past  Surgical History:  Procedure Laterality Date   APPENDECTOMY     SHOULDER OPEN ROTATOR CUFF REPAIR Left    TONSILLECTOMY      Social History: Social History   Socioeconomic History   Marital status: Single    Spouse name: Not on file   Number of children: 9   Years of education: Not on file   Highest education level: Not on file  Occupational History   Not on file  Tobacco Use   Smoking status: Former    Current packs/day: 0.00    Average packs/day: 0.2 packs/day for 22.0 years (4.4 ttl pk-yrs)    Types: Cigarettes    Start date: 55    Quit date: 2019    Years since quitting: 5.6   Smokeless tobacco: Never  Substance and Sexual Activity   Alcohol use: No    Comment: occasional at most   Drug use: No   Sexual activity: Yes    Partners: Male  Other Topics Concern   Not on file  Social History Narrative   Not on file   Social Determinants of Health   Financial Resource Strain: Not on file  Food Insecurity: No Food Insecurity (04/26/2023)   Hunger Vital Sign    Worried About Running Out of Food in the Last Year: Never true    Ran Out of Food in the Last Year: Never true  Transportation Needs: No Transportation Needs (04/26/2023)   PRAPARE - Administrator, Civil Service (Medical): No    Lack of Transportation (Non-Medical): No  Physical Activity: Not on file  Stress: Not on file  Social Connections: Unknown (01/20/2022)   [facility] from [facility]   Social Network    Social Network: Not on file  Intimate Partner Violence: Not At Risk (04/26/2023)   Humiliation, Afraid, Rape, and Kick questionnaire    Fear of Current or Ex-Partner: No    Emotionally Abused: No    Physically Abused: No    Sexually Abused: No    Family History: Family History  Problem Relation Age of Onset   Healthy Mother    Healthy Father    Cancer Paternal Grandmother     Current Medications:  Current Outpatient Medications:    diazepam (VALIUM) 10 MG  tablet, Take 10 mg by mouth daily as needed., Disp: , Rfl:    fluticasone (FLONASE) 50 MCG/ACT nasal spray, Place 2 sprays into both nostrils daily., Disp: 16 g, Rfl: 6   gabapentin (NEURONTIN) 100 MG capsule, Take 1 capsule (100 mg total) by mouth 3 (three) times daily as needed., Disp: 90 capsule, Rfl: 0   hydrOXYzine (VISTARIL) 25 MG capsule, Take 1 capsule (25 mg total) by mouth every 8 (eight) hours as needed., Disp: 30 capsule, Rfl: 0   sertraline (ZOLOFT) 50 MG tablet, Take 1 tablet (50  mg total) by mouth daily., Disp: 30 tablet, Rfl: 3   Allergies: No Known Allergies  REVIEW OF SYSTEMS:   Review of Systems  Constitutional:  Negative for chills, fatigue and fever.  HENT:   Negative for lump/mass, mouth sores, nosebleeds, sore throat and trouble swallowing.   Eyes:  Negative for eye problems.  Respiratory:  Positive for shortness of breath. Negative for cough.   Cardiovascular:  Negative for chest pain, leg swelling and palpitations.  Gastrointestinal:  Negative for abdominal pain, constipation, diarrhea, nausea and vomiting.  Genitourinary:  Negative for bladder incontinence, difficulty urinating, dysuria, frequency, hematuria and nocturia.   Musculoskeletal:  Negative for arthralgias, back pain, flank pain, myalgias and neck pain.  Skin:  Negative for itching and rash.  Neurological:  Positive for numbness. Negative for dizziness and headaches.  Hematological:  Does not bruise/bleed easily.  Psychiatric/Behavioral:  Negative for depression, sleep disturbance and suicidal ideas. The patient is not nervous/anxious.   All other systems reviewed and are negative.    VITALS:   Blood pressure 133/85, pulse 91, temperature 98 F (36.7 C), temperature source Oral, resp. rate 16, weight 225 lb 9.6 oz (102.3 kg), SpO2 96%.  Wt Readings from Last 3 Encounters:  05/25/23 225 lb 9.6 oz (102.3 kg)  04/26/23 228 lb 12.8 oz (103.8 kg)  03/29/23 225 lb (102.1 kg)    Body mass index is  36.41 kg/m.  Performance status (ECOG): 0 - Asymptomatic  PHYSICAL EXAM:   Physical Exam Vitals and nursing note reviewed. Exam conducted with a chaperone present.  Constitutional:      Appearance: Normal appearance.  Cardiovascular:     Rate and Rhythm: Normal rate and regular rhythm.     Pulses: Normal pulses.     Heart sounds: Normal heart sounds.  Pulmonary:     Effort: Pulmonary effort is normal.     Breath sounds: Normal breath sounds.  Abdominal:     Palpations: Abdomen is soft. There is no hepatomegaly, splenomegaly or mass.     Tenderness: There is no abdominal tenderness.  Musculoskeletal:     Right lower leg: No edema.     Left lower leg: No edema.  Lymphadenopathy:     Cervical: No cervical adenopathy.     Right cervical: No superficial, deep or posterior cervical adenopathy.    Left cervical: No superficial, deep or posterior cervical adenopathy.     Upper Body:     Right upper body: No supraclavicular or axillary adenopathy.     Left upper body: No supraclavicular or axillary adenopathy.  Neurological:     General: No focal deficit present.     Mental Status: He is alert and oriented to person, place, and time.  Psychiatric:        Mood and Affect: Mood normal.        Behavior: Behavior normal.     LABS:      Latest Ref Rng & Units 04/26/2023    9:12 AM 03/31/2023    9:33 AM 02/21/2023   10:09 AM  CBC  WBC 4.0 - 10.5 K/uL 7.6  8.4  9.7    10.1   Hemoglobin 13.0 - 17.0 g/dL 02.7  25.3  66.4    40.3   Hematocrit 39.0 - 52.0 % 50.1  51.9  49.5    51.5   Platelets 150 - 400 K/uL 260  281  310    324       Latest Ref Rng & Units 03/31/2023  9:33 AM 01/19/2023    8:20 AM 06/04/2020    4:53 AM  CMP  Glucose 70 - 99 mg/dL 90  147  829   BUN 6 - 24 mg/dL 13  21  18    Creatinine 0.76 - 1.27 mg/dL 5.62  1.30  8.65   Sodium 134 - 144 mmol/L 141  141  137   Potassium 3.5 - 5.2 mmol/L 4.5  4.6  3.8   Chloride 96 - 106 mmol/L 103  104  104   CO2 20 - 29  mmol/L 22  22  22    Calcium 8.7 - 10.2 mg/dL 9.2  9.0  8.9   Total Protein 6.0 - 8.5 g/dL  7.2  7.2   Total Bilirubin 0.0 - 1.2 mg/dL  0.8  0.9   Alkaline Phos 44 - 121 IU/L  85  65   AST 0 - 40 IU/L  26  20   ALT 0 - 44 IU/L  27  29      No results found for: "CEA1", "CEA" / No results found for: "CEA1", "CEA" No results found for: "PSA1" No results found for: "HQI696" No results found for: "CAN125"  Lab Results  Component Value Date   TOTALPROTELP 6.9 04/26/2023   TOTALPROTELP 6.9 04/26/2023   ALBUMINELP 3.9 04/26/2023   A1GS 0.2 04/26/2023   A2GS 0.6 04/26/2023   BETS 1.1 04/26/2023   GAMS 1.0 04/26/2023   MSPIKE Not Observed 04/26/2023   SPEI Comment 04/26/2023   No results found for: "TIBC", "FERRITIN", "IRONPCTSAT" Lab Results  Component Value Date   LDH 109 04/26/2023     STUDIES:   No results found.

## 2023-05-25 ENCOUNTER — Inpatient Hospital Stay: Payer: BC Managed Care – PPO | Attending: Hematology | Admitting: Hematology

## 2023-05-25 VITALS — BP 133/85 | HR 91 | Temp 98.0°F | Resp 16 | Wt 225.6 lb

## 2023-05-25 DIAGNOSIS — L732 Hidradenitis suppurativa: Secondary | ICD-10-CM | POA: Diagnosis not present

## 2023-05-25 DIAGNOSIS — D72820 Lymphocytosis (symptomatic): Secondary | ICD-10-CM | POA: Diagnosis not present

## 2023-05-25 DIAGNOSIS — R7989 Other specified abnormal findings of blood chemistry: Secondary | ICD-10-CM | POA: Diagnosis not present

## 2023-05-25 NOTE — Patient Instructions (Addendum)
[facility]  Discharge Instructions  You were seen and examined today by Dr. Ellin Saba.  Dr. Ellin Saba discussed your most recent lab work which revealed that one of the inflammatory markers was slightly elevated and the T cell gene was slightly positive but this can happen from infection.   Dr. Ellin Saba recommends repeating some labs before your next appointment.  Follow-up as scheduled.    Thank you for choosing [facility] to provide your oncology and hematology care.   To afford each patient quality time with our provider, please arrive at least 15 minutes before your scheduled appointment time. You may need to reschedule your appointment if you arrive late (10 or more minutes). Arriving late affects you and other patients whose appointments are after yours.  Also, if you miss three or more appointments without notifying the office, you may be dismissed from the clinic at the provider's discretion.    Again, thank you for choosing [facility].  Our hope is that these requests will decrease the amount of time that you wait before being seen by our physicians.   If you have a lab appointment with the Cancer Center - please note that after April 8th, all labs will be drawn in the cancer center.  You do not have to check in or register with the main entrance as you have in the past but will complete your check-in at the cancer center.            _____________________________________________________________  Should you have questions after your visit to [facility], please contact our office at [phone] and follow the prompts.  Our office hours are 8:00 a.m. to 4:30 p.m. Monday - Thursday and 8:00 a.m. to 2:30 p.m. Friday.  Please note that voicemails left after 4:00 p.m. may not be returned until the following business day.  We are closed weekends and all major holidays.  You do have access to a nurse  24-7, just call the main number to the clinic [phone] and do not press any options, hold on the line and a nurse will answer the phone.    For prescription refill requests, have your pharmacy contact our office and allow 72 hours.    Masks are no longer required in the cancer centers. If you would like for your care team to wear a mask while they are taking care of you, please let them know. You may have one support person who is at least 42 years old accompany you for your appointments.

## 2023-06-13 ENCOUNTER — Encounter: Payer: Self-pay | Admitting: Internal Medicine

## 2023-06-13 ENCOUNTER — Other Ambulatory Visit: Payer: Self-pay | Admitting: Internal Medicine

## 2023-06-13 DIAGNOSIS — G5600 Carpal tunnel syndrome, unspecified upper limb: Secondary | ICD-10-CM

## 2023-06-13 MED ORDER — GABAPENTIN 300 MG PO CAPS: 300.0000 mg | ORAL_CAPSULE | Freq: Three times a day (TID) | ORAL | 3 refills | Status: DC

## 2023-06-14 ENCOUNTER — Other Ambulatory Visit: Payer: Self-pay

## 2023-06-14 DIAGNOSIS — F320 Major depressive disorder, single episode, mild: Secondary | ICD-10-CM

## 2023-06-14 DIAGNOSIS — F411 Generalized anxiety disorder: Secondary | ICD-10-CM

## 2023-06-14 MED ORDER — SERTRALINE HCL 50 MG PO TABS: 50.0000 mg | ORAL_TABLET | Freq: Every day | ORAL | 3 refills | Status: DC

## 2023-06-14 MED ORDER — GABAPENTIN 300 MG PO CAPS: 300.0000 mg | ORAL_CAPSULE | Freq: Three times a day (TID) | ORAL | 3 refills | Status: AC

## 2023-06-21 ENCOUNTER — Encounter: Payer: Self-pay | Admitting: Orthopedic Surgery

## 2023-06-21 ENCOUNTER — Ambulatory Visit (INDEPENDENT_AMBULATORY_CARE_PROVIDER_SITE_OTHER): Payer: BC Managed Care – PPO | Admitting: Orthopedic Surgery

## 2023-06-21 VITALS — BP 127/82 | HR 114 | Ht 66.0 in | Wt 229.0 lb

## 2023-06-21 DIAGNOSIS — R202 Paresthesia of skin: Secondary | ICD-10-CM

## 2023-06-21 MED ORDER — PREDNISONE 10 MG (21) PO TBPK: ORAL_TABLET | ORAL | 0 refills | Status: DC

## 2023-06-21 NOTE — Progress Notes (Signed)
New Patient Visit  Assessment: [name] is a 42 y.o. male with the following: 1. Paresthesia of skin  Plan: [name] has shooting pains, burning, numbness and tingling bilateral hands.  Right is much worse than the left.  He is right-hand dominant.  Presentation is consistent with carpal tunnel syndrome.  I am recommending bilateral upper extremity nerve conduction studies.  Once these results are available, he will return to clinic to discuss the findings.  We did briefly discuss carpal tunnel release.  I think depending on the results of the EMG, he will likely require at least right sided open release.  I have also provided him with a short course of prednisone, to help with his acute symptoms overall.  Follow-up: Return for After EMG.  Subjective:  Chief Complaint  Patient presents with   Wrist Pain    Bil wrist pain getting worse over the past 6 mos. 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    History of Present Illness: [name] is a 42 y.o. male who has been referred by Christel Mormon, MD for evaluation of bilateral hand pain.  He states for the past 6 months, he has had burning and shooting pains in the right hand.  It has progressively worsened.  It keeps him up at night.  Symptoms get worse overnight.  He started develop some symptoms in the left wrist, but these are not as severe as the right.  He has recently started gabapentin.  He has tried a brace at night.  Nothing is helping the symptoms.  He works as a Curator, as well as a Insurance underwriter.  He notes his symptoms get worse at work.   Review of Systems: No fevers or chills + numbness or tingling No chest pain No shortness of breath No bowel or bladder dysfunction No GI distress No headaches   Medical History:  Past Medical History:  Diagnosis Date   C. difficile colitis    Diverticulitis     Past Surgical History:  Procedure Laterality Date   APPENDECTOMY      SHOULDER OPEN ROTATOR CUFF REPAIR Left    TONSILLECTOMY      Family History  Problem Relation Age of Onset   Healthy Mother    Healthy Father    Cancer Paternal Grandmother    Social History   Tobacco Use   Smoking status: Former    Current packs/day: 0.00    Average packs/day: 0.2 packs/day for 22.0 years (4.4 ttl pk-yrs)    Types: Cigarettes    Start date: 67    Quit date: 2019    Years since quitting: 5.7   Smokeless tobacco: Never  Substance Use Topics   Alcohol use: No    Comment: occasional at most   Drug use: No    No Known Allergies  Current Meds  Medication Sig   diazepam (VALIUM) 10 MG tablet Take 10 mg by mouth daily as needed.   fluticasone (FLONASE) 50 MCG/ACT nasal spray Place 2 sprays into both nostrils daily.   gabapentin (NEURONTIN) 300 MG capsule Take 1 capsule (300 mg total) by mouth 3 (three) times daily.   hydrOXYzine (VISTARIL) 25 MG capsule Take 1 capsule (25 mg total) by mouth every 8 (eight) hours as needed.   predniSONE (STERAPRED UNI-PAK 21 TAB) 10 MG (21) TBPK tablet 10 mg DS 12 as directed   sertraline (ZOLOFT) 50 MG tablet Take 1 tablet (50 mg total)  by mouth daily.    Objective: BP 127/82   Pulse (!) 114   Ht 5\' 6"  (1.676 m)   Wt 229 lb (103.9 kg)   BMI 36.96 kg/m   Physical Exam:  General: Alert and oriented. and No acute distress. Gait: Normal gait.  Bilateral hands without deformity.  No muscle atrophy.  Positive Tinel's bilateral carpal tunnel.  Positive Phalen's bilateral carpal tunnel.  Positive Durkan's bilateral carpal tunnel.  Decree sensation in median nerve distribution of the right hand.  Normal sensation throughout the left hand.  2+ radial pulses bilaterally.  IMAGING: No new imaging obtained today   New Medications:  Meds ordered this encounter  Medications   predniSONE (STERAPRED UNI-PAK 21 TAB) 10 MG (21) TBPK tablet    Sig: 10 mg DS 12 as directed    Dispense:  48 tablet    Refill:  0      Oliver Barre, MD  06/21/2023 2:44 PM

## 2023-06-21 NOTE — Addendum Note (Signed)
Addended by: Baird Kay on: 06/21/2023 02:49 PM   Modules accepted: Orders

## 2023-06-29 ENCOUNTER — Encounter: Payer: BC Managed Care – PPO | Admitting: Physical Medicine and Rehabilitation

## 2023-07-01 ENCOUNTER — Ambulatory Visit: Payer: BC Managed Care – PPO | Admitting: Urology

## 2023-07-01 VITALS — BP 130/83 | HR 88

## 2023-07-01 DIAGNOSIS — Z302 Encounter for sterilization: Secondary | ICD-10-CM

## 2023-07-01 DIAGNOSIS — Z9852 Vasectomy status: Secondary | ICD-10-CM

## 2023-07-01 NOTE — Patient Instructions (Signed)
Vasectomy Postoperative Instructions ? ?Please bring back a semen analysis in approximately 3 months.  ?Your semen analysis  will need to be taken to  ?[facility]  [address], [place], [place] [address] ?[phone] ? ? You will be given a sterile specimen cup. Please label the cup with your name, date of birth, date and time of collections.  ?What to Expect ? - slight redness, swelling and scant drainage along the incision ? - mild to moderate discomfort ? - black and blue (bruising) as the tissue heals ? - low grade fever ? - scrotal sensitivity and/or tenderness ?- Edges of the incision may pull apart and heal slowly, sometimes a knot may be present which remains for several months.  This is NORMAL and all part of the healing process. ?- if stitches are placed, they do not need to be removed ?- if you have pain or discomfort immediately after the vasectomy, you may use OTC pain medication for relief , ex: tylenol.  After local anesthetic wears off an ice pack will provide additional comfort and can also prevent swelling if used ? ?Activity ? - no sexual intercourse for at lease 5 days depending on comfort ? - no heavy lifting for 48-72 hours (anything over 5-10 lbs) ? ?Wound Care ? - shower only after 24 hours ? - no tub baths, hot tub, or pools for at least 7 days ? - ice packs for 48 hours: 30 minutes on and 30 minutes off ? ?Problem to Report ? - generalized redness ? - increased pain and swelling ? - fever greater than 101 F ? - significant drainage or bleeding from the wound ? ?TO DO ?- Ejaculations help to clear the passage of sperm, but you must use another from of birth control until you are told you may discontinue its use!! ?- You will be given a specimen cup to bring back a semen sample in 3 months to check and see if its clear of sperm.  Only after the semen is sent for analysis and is reported back as clear should you use this as your primary form of birth control!    ?

## 2023-07-07 NOTE — Progress Notes (Signed)
07/01/2023  CC: desires sterilization   HPI: [name] is a 41yo here for vasectomy Blood pressure 130/83, pulse 88. NED. A&Ox3.   No respiratory distress   Abd soft, NT, ND Normal external genitalia with patent urethral meatus  A timeout was performed.  Patient's identity and consent was confirmed.  All questions were answered.   Bilateral Vasectomy Procedure  Pre-Procedure: - Patient's scrotum was prepped and draped for vasectomy. - The vas was palpated through the scrotal skin on the left. - 1% Xylocaine was injected into the skin and surrounding tissue for placement  - In a similar manner, the vas on the right was identified, anesthetized, and stabilized.  Procedure: - A sharp hemostat was used to make a small stab incision in the skin overlying the vas - The left vas was isolated and brought up through the incision exposing that structure. - Bleeding points were cauterized as they occurred. - The vas was free from the surrounding structures and brought to the view. - A segment was positioned for placement with a hemostat. - A second hemostat was placed and a small segment between the two hemostats and was removed for inspection. - Each end of the transected vas lumen was fulgurated/ obliterated using needlepoint electrocautery -A fascial interposition was performed on testicular end of the vas using #3-0 chromic suture -The same procedure was performed on the right. - A single suture of #3-0 chromic catgut was used to close each lateral scrotal skin incision - A dressing was applied.  Post-Procedure: - Patient was instructed in care of the operative area - A specimen is to be delivered in 12 weeks   -Another form of contraception is to be used until post vasectomy semen analysis  Tyler Aye, MD

## 2023-08-16 ENCOUNTER — Ambulatory Visit: Payer: BC Managed Care – PPO | Admitting: Internal Medicine

## 2023-08-24 ENCOUNTER — Encounter: Payer: Self-pay | Admitting: Internal Medicine

## 2023-09-16 ENCOUNTER — Other Ambulatory Visit: Payer: Self-pay | Admitting: Internal Medicine

## 2023-09-16 DIAGNOSIS — F411 Generalized anxiety disorder: Secondary | ICD-10-CM

## 2023-09-16 DIAGNOSIS — F320 Major depressive disorder, single episode, mild: Secondary | ICD-10-CM

## 2023-09-28 ENCOUNTER — Encounter: Payer: Self-pay | Admitting: Orthopedic Surgery

## 2023-09-28 ENCOUNTER — Ambulatory Visit: Payer: BC Managed Care – PPO | Admitting: Orthopedic Surgery

## 2023-09-28 VITALS — BP 138/83 | HR 94 | Ht 66.0 in | Wt 232.0 lb

## 2023-09-28 DIAGNOSIS — G5601 Carpal tunnel syndrome, right upper limb: Secondary | ICD-10-CM | POA: Diagnosis not present

## 2023-09-28 NOTE — Progress Notes (Signed)
 Return patient Visit  Assessment: [name] is a 43 y.o. male with the following: Right carpal tunnel syndrome  Plan: LATHEN CUCCO has shooting pains, burning, numbness and tingling bilateral hands.  Right is much worse than the left.  Based on his physical presentation, he has carpal tunnel syndrome on the right.  It is starting to affect him at work on a daily basis.  Prednisone  was helpful, but his pain has returned, and is now worse.  He has shooting pains, which prevented him from continuing with work.  On a daily basis, he has to stop, to allow his sensation to return.  He is a good candidate for surgery.  Procedure was discussed in detail.  All questions have been answered.  He would like to proceed with open right carpal tunnel release, but will have to discuss this with work in regards to timing.  He will contact the clinic when he is ready to proceed with surgery.  Risks and benefits of the surgery, including, but not limited to infection, bleeding, persistent pain, need for further surgery, recurrence of symptoms, residual numbness and tingling, blood clots and more severe complications associated with anesthesia were discussed with the patient.  The patient has elected to proceed.   Follow-up: Return for After surgery; date of surgery to be determined..  Subjective:  Chief Complaint  Patient presents with   Hand Pain    Bilat hand pain numbness and tingling becoming more frequent. Was unable to make his appointment w/ Dr. Daisey Dryer due to work.     History of Present Illness: [name] is a 43 y.o. male who returns to clinic for repeat evaluation of right hand pain.  I previously saw him for both hands.  He has numbness and tingling in both hands, but right is much worse.  I referred him for EMGs, but he missed his appointment because of work requirements.  Prednisone  improved his symptoms for about 3 weeks.  The pain is much worse now.  He notes numbness and tingling on  a daily basis.  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  It is starting to affect his ability to complete his tasks.   Review of Systems: No fevers or chills + numbness or tingling No chest pain No shortness of breath No bowel or bladder dysfunction No GI distress No headaches    Objective: BP 138/83   Pulse 94   Ht 5\' 6"  (1.676 m)   Wt 232 lb (105.2 kg)   BMI 37.45 kg/m   Physical Exam:  General: Alert and oriented. and No acute distress. Gait: Normal gait.  Right hand without deformity.  No atrophy.  Positive Tinel's.  Positive carpal tunnel compression.  Positive Phalen's.  Fingers warm well-perfused.  No tenderness to palpation over the A1 pulleys throughout the right hand.   IMAGING: No new imaging obtained today   New Medications:  No orders of the defined types were placed in this encounter.     Tonita Frater, MD  09/28/2023 10:31 AM

## 2023-09-28 NOTE — Patient Instructions (Signed)
 Your surgery will be at [facility], scheduled with Dr Sharol Decamp   The hospital will contact you with a preoperative appointment to discuss Anesthesia. The phone number is [phone]   Please bring your medications with you for the appointment.  They will tell you the arrival time and medication instructions when you have your preoperative evaluation.  Do not wear nail polish the day of your surgery and if you take Phentermine you need to stop this medication ONE WEEK prior to your surgery.    If you take an blood thinning medication, we will need to stop this prior to surgery.  Typically, we stop this medicine at least 5 days prior to surgery.  We will need to confirm this with the doctor who prescribes this medication.  If you are taking medications or an injection for diabetes, or for weight management, this medicine will need to be stopped at least 7 days prior to surgery.     Date of surgery will be determined pending authorization by your insurance company.

## 2023-10-02 DIAGNOSIS — K5730 Diverticulosis of large intestine without perforation or abscess without bleeding: Secondary | ICD-10-CM | POA: Diagnosis not present

## 2023-10-02 DIAGNOSIS — E0590 Thyrotoxicosis, unspecified without thyrotoxic crisis or storm: Secondary | ICD-10-CM | POA: Diagnosis not present

## 2023-10-02 DIAGNOSIS — R404 Transient alteration of awareness: Secondary | ICD-10-CM | POA: Diagnosis not present

## 2023-10-02 DIAGNOSIS — R112 Nausea with vomiting, unspecified: Secondary | ICD-10-CM | POA: Diagnosis not present

## 2023-10-02 DIAGNOSIS — R1110 Vomiting, unspecified: Secondary | ICD-10-CM | POA: Diagnosis not present

## 2023-10-02 DIAGNOSIS — Z20822 Contact with and (suspected) exposure to covid-19: Secondary | ICD-10-CM | POA: Diagnosis not present

## 2023-10-02 DIAGNOSIS — R531 Weakness: Secondary | ICD-10-CM | POA: Diagnosis not present

## 2023-10-02 DIAGNOSIS — R4182 Altered mental status, unspecified: Secondary | ICD-10-CM | POA: Diagnosis not present

## 2023-10-02 DIAGNOSIS — R451 Restlessness and agitation: Secondary | ICD-10-CM | POA: Diagnosis not present

## 2023-10-05 ENCOUNTER — Encounter: Payer: Self-pay | Admitting: Internal Medicine

## 2023-10-05 NOTE — Telephone Encounter (Signed)
Copied from CRM [id number]. Topic: Appointments - Appointment Cancel/Reschedule >> Oct 05, 2023 10:08 AM Victorino Dike T wrote: Patient wanted to come in sooner than 2/3 but there is nothing available.  Went to the Emergency Room on Sunday with shakes and chills with confusion and weakness. They told him that lactate was high, white blood cell count was high as well, please call patient [phone]

## 2023-10-06 NOTE — Telephone Encounter (Signed)
Looks like he went to [facility].  He has follow up with his PCP on 2/3 to reassess infection.  Would you like any labs to be done sooner for him?  Lab results are in EPIC from visit.

## 2023-10-06 NOTE — Telephone Encounter (Signed)
Already scheduled 2/3 will add to waiting list

## 2023-10-13 ENCOUNTER — Ambulatory Visit (INDEPENDENT_AMBULATORY_CARE_PROVIDER_SITE_OTHER): Payer: BC Managed Care – PPO | Admitting: Family Medicine

## 2023-10-13 VITALS — BP 116/72 | HR 85 | Resp 16 | Ht 66.0 in | Wt 232.0 lb

## 2023-10-13 DIAGNOSIS — R112 Nausea with vomiting, unspecified: Secondary | ICD-10-CM

## 2023-10-13 DIAGNOSIS — R110 Nausea: Secondary | ICD-10-CM | POA: Diagnosis not present

## 2023-10-13 NOTE — Patient Instructions (Signed)
I appreciate the opportunity to provide care to you today!      Labs: please stop by the lab today to get your blood drawn (CBC, BMP, TSH, Lactate acid)   Please seek immediate medical attention at the Emergency Department if you experience any of the following symptoms in conjunction with your headaches:  Acute onset of a severe headache described as "the worst headache of my life," particularly in a patient with no prior history of headaches. Unrelenting headaches that do not improve with conservative treatments or pain that progressively worsens. Lancinating or "ice-pick" pain. Severe headaches accompanied by a stiff neck and/or fever. Headaches associated with changes in mental status or level of consciousness. Persistent headaches following trauma to the head or neck. A significant change in pattern or severity of headaches in a patient with a longstanding history of chronic headaches. Your prompt evaluation is crucial for appropriate diagnosis and management.   Attached with your AVS, you will find valuable resources for self-education. I highly recommend dedicating some time to thoroughly examine them.   Please continue to a heart-healthy diet and increase your physical activities. Try to exercise for at least five days a week.    It was a pleasure to see you and I look forward to continuing to work together on your health and well-being. Please do not hesitate to call the office if you need care or have questions about your care.  In case of emergency, please visit the Emergency Department for urgent care, or contact our clinic at [phone] to schedule an appointment. We're here to help you!   Have a wonderful day and week. With Gratitude, Gilmore Laroche MSN, FNP-BC

## 2023-10-13 NOTE — Progress Notes (Signed)
Established Patient Office Visit  Subjective:  Patient ID: [name], male    DOB: 1980-10-21  Age: 43 y.o. MRN: [id number]  CC:  Chief Complaint  Patient presents with   ER follow up    On 1/19 he started having weakness, vomiting that came out of nowwhere. Started getting confused and wife made him go to the ER.     HPI [name] is a 43 y.o. male with past medical history of  Large granular lymphocytosis, elevated hemoglobin, MDD presents for hospital f/u.  Nausea and Vomiting:The patient was seen in the ED on 10/02/2023 with complaints of nausea, vomiting, and dizziness with a sudden onset. The patient reported feeling off-balance, lightheaded, and shaky, with persistent tachycardia.A CT scan of the abdomen and pelvis showed no acute abnormalities. A CT scan of the head showed no acute intracranial process, and a chest X-ray revealed no acute disease.Laboratory studies showed no leukocytosis or anemia, and electrolytes, liver function tests, and kidney function were within normal limits. Urinalysis showed no evidence of infection. An EKG was unremarkable. 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 He denies any recurrent symptoms. No systematic symptoms reported.  Past Medical History:  Diagnosis Date   C. difficile colitis    Diverticulitis     Past Surgical History:  Procedure Laterality Date   APPENDECTOMY     SHOULDER OPEN ROTATOR CUFF REPAIR Left    TONSILLECTOMY      Family History  Problem Relation Age of Onset   Healthy Mother    Healthy Father    Cancer Paternal Grandmother     Social History   Socioeconomic History   Marital status: Single    Spouse name: Not on file   Number of children: 9   Years of education: Not on file   Highest education level: Not on file  Occupational History    Not on file  Tobacco Use   Smoking status: Former    Current packs/day: 0.00    Average packs/day: 0.2 packs/day for 22.0 years (4.4 ttl pk-yrs)    Types: Cigarettes    Start date: 65    Quit date: 2019    Years since quitting: 6.0   Smokeless tobacco: Never  Substance and Sexual Activity   Alcohol use: No    Comment: occasional at most   Drug use: No   Sexual activity: Yes    Partners: Male  Other Topics Concern   Not on file  Social History Narrative   Not on file   Social Drivers of Health   Financial Resource Strain: Not on file  Food Insecurity: No Food Insecurity (04/26/2023)   Hunger Vital Sign    Worried About Running Out of Food in the Last Year: Never true    Ran Out of Food in the Last Year: Never true  Transportation Needs: No Transportation Needs (04/26/2023)   PRAPARE - Administrator, Civil Service (Medical): No    Lack of Transportation (Non-Medical): No  Physical Activity: Not on file  Stress: Not on file  Social Connections: Unknown (01/20/2022)   Received from [facility], [facility]   Social Network    Social Network: Not on file  Intimate Partner Violence: Not At Risk (04/26/2023)   Humiliation, Afraid, Rape, and Kick questionnaire    Fear of Current or Ex-Partner: No  Emotionally Abused: No    Physically Abused: No    Sexually Abused: No    Outpatient Medications Prior to Visit  Medication Sig Dispense Refill   diazepam (VALIUM) 10 MG tablet Take 10 mg by mouth daily as needed.     fluticasone (FLONASE) 50 MCG/ACT nasal spray Place 2 sprays into both nostrils daily. 16 g 6   gabapentin (NEURONTIN) 300 MG capsule Take 1 capsule (300 mg total) by mouth 3 (three) times daily. 90 capsule 3   hydrOXYzine (VISTARIL) 25 MG capsule Take 1 capsule (25 mg total) by mouth every 8 (eight) hours as needed. 30 capsule 0   predniSONE (STERAPRED UNI-PAK 21 TAB) 10 MG (21) TBPK tablet 10 mg DS 12 as directed 48 tablet 0   sertraline (ZOLOFT)  50 MG tablet TAKE 1 TABLET BY MOUTH EVERY DAY 90 tablet 1   No facility-administered medications prior to visit.    No Known Allergies  ROS Review of Systems  Constitutional:  Negative for fatigue and fever.  Eyes:  Negative for visual disturbance.  Respiratory:  Negative for chest tightness and shortness of breath.   Cardiovascular:  Negative for chest pain and palpitations.  Neurological:  Negative for dizziness and headaches.      Objective:    Physical Exam HENT:     Head: Normocephalic.     Right Ear: External ear normal.     Left Ear: External ear normal.     Nose: No congestion or rhinorrhea.     Mouth/Throat:     Mouth: Mucous membranes are moist.  Cardiovascular:     Rate and Rhythm: Regular rhythm.     Heart sounds: No murmur heard. Pulmonary:     Effort: No respiratory distress.     Breath sounds: Normal breath sounds.  Neurological:     Mental Status: He is alert.     BP 116/72   Pulse 85   Resp 16   Ht 5\' 6"  (1.676 m)   Wt 232 lb (105.2 kg)   BMI 37.45 kg/m  Wt Readings from Last 3 Encounters:  10/13/23 232 lb (105.2 kg)  09/28/23 232 lb (105.2 kg)  06/21/23 229 lb (103.9 kg)    Lab Results  Component Value Date   TSH 0.538 01/19/2023   Lab Results  Component Value Date   WBC 7.6 04/26/2023   HGB 17.0 04/26/2023   HCT 50.1 04/26/2023   MCV 90.6 04/26/2023   PLT 260 04/26/2023   Lab Results  Component Value Date   NA 141 03/31/2023   K 4.5 03/31/2023   CO2 22 03/31/2023   GLUCOSE 90 03/31/2023   BUN 13 03/31/2023   CREATININE 0.84 03/31/2023   BILITOT 0.8 01/19/2023   ALKPHOS 85 01/19/2023   AST 26 01/19/2023   ALT 27 01/19/2023   PROT 7.2 01/19/2023   ALBUMIN 4.5 01/19/2023   CALCIUM 9.2 03/31/2023   ANIONGAP 11 06/04/2020   EGFR 112 03/31/2023   Lab Results  Component Value Date   CHOL 180 01/19/2023   Lab Results  Component Value Date   HDL 37 (L) 01/19/2023   Lab Results  Component Value Date   LDLCALC 119 (H)  01/19/2023   Lab Results  Component Value Date   TRIG 131 01/19/2023   Lab Results  Component Value Date   CHOLHDL 4.9 01/19/2023   Lab Results  Component Value Date   HGBA1C 5.4 01/19/2023      Assessment & Plan:  Nausea and vomiting,  unspecified vomiting type Assessment & Plan: Labs and imaging studies were reviewed. Labs will be repeated today to assess for stability. The patient is encouraged to increase hydration, rest, and take Tylenol as needed for headaches.  The patient is encouraged to seek immediate medical attention at the Emergency Department if he experience any of the following symptoms in conjunction with your headaches:  Acute onset of a severe headache described as "the worst headache of my life," particularly in a patient with no prior history of headaches. Unrelenting headaches that do not improve with conservative treatments or pain that progressively worsens. Lancinating or "ice-pick" pain. Severe headaches accompanied by a stiff neck and/or fever. Headaches associated with changes in mental status or level of consciousness. Persistent headaches following trauma to the head or neck. A significant change in pattern or severity of headaches in a patient with a longstanding history of chronic headaches. Your prompt evaluation is crucial for appropriate diagnosis and management.   Orders: -     TSH + free T4 -     CBC with Differential/Platelet -     BMP8+EGFR -     Lactic acid, plasma  Note: This chart has been completed using Engineer, civil (consulting) software, and while attempts have been made to ensure accuracy, certain words and phrases may not be transcribed as intended.    Follow-up: No follow-ups on file.   Gilmore Laroche, FNP

## 2023-10-13 NOTE — Assessment & Plan Note (Signed)
Labs and imaging studies were reviewed. Labs will be repeated today to assess for stability. The patient is encouraged to increase hydration, rest, and take Tylenol as needed for headaches.  The patient is encouraged to seek immediate medical attention at the Emergency Department if he experience any of the following symptoms in conjunction with your headaches:  Acute onset of a severe headache described as "the worst headache of my life," particularly in a patient with no prior history of headaches. Unrelenting headaches that do not improve with conservative treatments or pain that progressively worsens. Lancinating or "ice-pick" pain. Severe headaches accompanied by a stiff neck and/or fever. Headaches associated with changes in mental status or level of consciousness. Persistent headaches following trauma to the head or neck. A significant change in pattern or severity of headaches in a patient with a longstanding history of chronic headaches. Your prompt evaluation is crucial for appropriate diagnosis and management.

## 2023-10-15 LAB — CBC WITH DIFFERENTIAL/PLATELET
Basophils Absolute: 0.1 10*3/uL (ref 0.0–0.2)
Basos: 1 %
EOS (ABSOLUTE): 0.5 10*3/uL — ABNORMAL HIGH (ref 0.0–0.4)
Eos: 6 %
Hematocrit: 47.1 % (ref 37.5–51.0)
Hemoglobin: 15.7 g/dL (ref 13.0–17.7)
Immature Grans (Abs): 0 10*3/uL (ref 0.0–0.1)
Immature Granulocytes: 0 %
Lymphocytes Absolute: 3.3 10*3/uL — ABNORMAL HIGH (ref 0.7–3.1)
Lymphs: 36 %
MCH: 30.5 pg (ref 26.6–33.0)
MCHC: 33.3 g/dL (ref 31.5–35.7)
MCV: 92 fL (ref 79–97)
Monocytes Absolute: 0.5 10*3/uL (ref 0.1–0.9)
Monocytes: 6 %
Neutrophils Absolute: 4.8 10*3/uL (ref 1.4–7.0)
Neutrophils: 51 %
Platelets: 313 10*3/uL (ref 150–450)
RBC: 5.15 x10E6/uL (ref 4.14–5.80)
RDW: 12.5 % (ref 11.6–15.4)
WBC: 9.4 10*3/uL (ref 3.4–10.8)

## 2023-10-15 LAB — BMP8+EGFR
BUN/Creatinine Ratio: 21 — ABNORMAL HIGH (ref 9–20)
BUN: 21 mg/dL (ref 6–24)
CO2: 21 mmol/L (ref 20–29)
Calcium: 9.1 mg/dL (ref 8.7–10.2)
Chloride: 103 mmol/L (ref 96–106)
Creatinine, Ser: 1 mg/dL (ref 0.76–1.27)
Glucose: 91 mg/dL (ref 70–99)
Potassium: 4.6 mmol/L (ref 3.5–5.2)
Sodium: 140 mmol/L (ref 134–144)
eGFR: 96 mL/min/{1.73_m2} (ref >59)

## 2023-10-15 LAB — TSH+FREE T4
Free T4: 1.03 ng/dL (ref 0.82–1.77)
TSH: 0.871 u[IU]/mL (ref 0.450–4.500)

## 2023-10-15 LAB — LACTIC ACID, PLASMA: Lactate, Ven: 8.1 mg/dL (ref 4.8–25.7)

## 2023-10-16 ENCOUNTER — Encounter: Payer: Self-pay | Admitting: Family Medicine

## 2023-10-17 ENCOUNTER — Inpatient Hospital Stay: Payer: BC Managed Care – PPO | Admitting: Internal Medicine

## 2023-10-20 ENCOUNTER — Inpatient Hospital Stay: Payer: BC Managed Care – PPO | Attending: Hematology

## 2023-10-20 ENCOUNTER — Inpatient Hospital Stay: Payer: BC Managed Care – PPO | Admitting: Hematology

## 2023-10-20 VITALS — BP 123/70 | HR 99 | Temp 98.0°F | Resp 20 | Wt 229.7 lb

## 2023-10-20 DIAGNOSIS — L732 Hidradenitis suppurativa: Secondary | ICD-10-CM | POA: Insufficient documentation

## 2023-10-20 DIAGNOSIS — Z79899 Other long term (current) drug therapy: Secondary | ICD-10-CM | POA: Diagnosis not present

## 2023-10-20 DIAGNOSIS — D72820 Lymphocytosis (symptomatic): Secondary | ICD-10-CM | POA: Insufficient documentation

## 2023-10-20 LAB — CBC WITH DIFFERENTIAL/PLATELET
Abs Immature Granulocytes: 0.04 10*3/uL (ref 0.00–0.07)
Basophils Absolute: 0.1 10*3/uL (ref 0.0–0.1)
Basophils Relative: 1 %
Eosinophils Absolute: 0.5 10*3/uL (ref 0.0–0.5)
Eosinophils Relative: 7 %
HCT: 46.7 % (ref 39.0–52.0)
Hemoglobin: 16.1 g/dL (ref 13.0–17.0)
Immature Granulocytes: 1 %
Lymphocytes Relative: 43 %
Lymphs Abs: 3.5 10*3/uL (ref 0.7–4.0)
MCH: 31.1 pg (ref 26.0–34.0)
MCHC: 34.5 g/dL (ref 30.0–36.0)
MCV: 90.3 fL (ref 80.0–100.0)
Monocytes Absolute: 0.8 10*3/uL (ref 0.1–1.0)
Monocytes Relative: 10 %
Neutro Abs: 2.9 10*3/uL (ref 1.7–7.7)
Neutrophils Relative %: 38 %
Platelets: 296 10*3/uL (ref 150–400)
RBC: 5.17 MIL/uL (ref 4.22–5.81)
RDW: 13.2 % (ref 11.5–15.5)
WBC: 7.8 10*3/uL (ref 4.0–10.5)
nRBC: 0 % (ref 0.0–0.2)

## 2023-10-20 LAB — LACTATE DEHYDROGENASE: LDH: 137 U/L (ref 98–192)

## 2023-10-20 NOTE — Progress Notes (Signed)
 [facility] [address], [place] [address]    Clinic Day:  10/20/2023  Referring physician: Melvenia Manus BRAVO, MD  Patient Care Team: Melvenia Manus BRAVO, MD as PCP - General (Internal Medicine)   ASSESSMENT & PLAN:   Assessment: 1.  Large granular lymphocytosis: - Patient seen at the request of Dr. Melvenia. - He has elevated absolute lymphocytosis since May 2024. - Flow cytometry by Dr. Melvenia (02/21/2023): No monoclonal B-cell population.  CD57 positive cells are increased and are composed of mixture of CD4 and CD8 positive T cells.  Population of CD8 positive T-cell LGL's account for approximately 8% of cellularity.  These LGL's are positive for CD3, CD8 and CD57.  No circulating blasts. - No fevers or weight loss.  Occasional night sweats once a month for the past few months.  He reports occasional tiredness and trouble sleeping which is new.  More frequent headaches are also noted.  Tingling in the hands left more than right present.  Also complains of dry eyes and mouth.  No joint pains or skin rashes. - Last infection: Diverticulitis in August 2023 when he was hospitalized. - We have reviewed blood work from 04/26/2023.  Infectious disease workup for hepatitis B and C, HTLV, HIV were negative.  ANA and rheumatoid factor were negative.  Plasma cell disorder workup was negative.  LDH was normal.  CRP was elevated at 1.6. - Patient has recurrent episodes of hidradenitis suppurativa which could be contributing to elevated CRP. - T-cell panel showed a clonal T-cell receptor gamma population detected. - This could be either reactive or represent a evolving clonal process.   2.  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  No chemical exposure.  Non-smoker. - Paternal grandmother had lupus and lung cancer.  Paternal great uncle has colon cancer.    Plan: 1.  Large granular lymphocytosis: - He denies any fevers but has night sweats, 2-3  times per month.  He has slightly more of time than before.  He also has boils on his skin under the arms, back and sides more than 1 week.  He was evaluated at [facility] with sepsis in January.  No clear source identified. - Physical exam did not reveal any palpable adenopathy or splenomegaly. - Labs today shows normal white count with normal differential.  LDH was normal. - We have sent T-cell receptor gene rearrangement study which is pending.  Patient will call in 2 weeks to discuss results. - RTC 6 months for follow-up with repeat labs.     Orders Placed This Encounter  Procedures   CBC with Differential    Standing Status:   Future    Expected Date:   04/18/2024    Expiration Date:   10/19/2024   Comprehensive metabolic panel    Standing Status:   Future    Expected Date:   04/18/2024    Expiration Date:   10/19/2024   Lactate dehydrogenase    Standing Status:   Future    Expected Date:   04/18/2024    Expiration Date:   10/19/2024      LILLETTE Hummingbird R Teague,acting as a scribe for Alean Stands, MD.,have documented all relevant documentation on the behalf of Alean Stands, MD,as directed by  Alean Stands, MD while in the presence of Alean Stands, MD.  I, Alean Stands MD, have reviewed the above documentation for accuracy and completeness, and I agree with the above.    Alean  Rogers, MD   2/6/20252:56 PM  CHIEF COMPLAINT:   Diagnosis: large granular lymphocytosis    Cancer Staging  No matching staging information was found for the patient.    Prior Therapy: None  Current Therapy: Observe patient   HISTORY OF PRESENT ILLNESS:   Oncology History   No history exists.     INTERVAL HISTORY:   [name] is a 43 y.o. male presenting to clinic today for follow up of large granular lymphocytosis. He was last seen by me on 05/25/23.  Since his last visit, he presented to the ED on 10/02/23 for nausea and vomiting with an unknown infection.  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 Infection symptoms were shakiness, mental confusion, and other flu-like symptoms.   Today, he states that he is doing well overall. His appetite level is at 50%. His energy level is at 60%.   He reports drenching night sweats 2-3 times a month for the past few months, which previously occurred 1 times a month. He notes numbness in the hands. He states hidradenitis suppurativa episodes occur multiple times a month predominately on the bilateral flanks, backs and armpits. Hibiclens improves boils.   PAST MEDICAL HISTORY:   Past Medical History: Past Medical History:  Diagnosis Date   C. difficile colitis    Diverticulitis     Surgical History: Past Surgical History:  Procedure Laterality Date   APPENDECTOMY     SHOULDER OPEN ROTATOR CUFF REPAIR Left    TONSILLECTOMY      Social History: Social History   Socioeconomic History   Marital status: Single    Spouse name: Not on file   Number of children: 9   Years of education: Not on file   Highest education level: Not on file  Occupational History   Not on file  Tobacco Use   Smoking status: Former    Current packs/day: 0.00    Average packs/day: 0.2 packs/day for 22.0 years (4.4 ttl pk-yrs)    Types: Cigarettes    Start date: 37    Quit date: 2019    Years since quitting: 6.1   Smokeless tobacco: Never  Substance and Sexual Activity   Alcohol use: No    Comment: occasional at most   Drug use: No   Sexual activity: Yes    Partners: Male  Other Topics Concern   Not on file  Social History Narrative   Not on file   Social Drivers of Health   Financial Resource Strain: Not on file  Food Insecurity: No Food Insecurity (04/26/2023)   Hunger Vital Sign    Worried About Running Out of Food in the Last Year: Never true    Ran Out of Food in the Last Year: Never true  Transportation Needs: No Transportation Needs (04/26/2023)   PRAPARE -  Administrator, Civil Service (Medical): No    Lack of Transportation (Non-Medical): No  Physical Activity: Not on file  Stress: Not on file  Social Connections: Unknown (01/20/2022)   Received from [facility], [facility]   Social Network    Social Network: Not on file  Intimate Partner Violence: Not At Risk (04/26/2023)   Humiliation, Afraid, Rape, and Kick questionnaire    Fear of Current or Ex-Partner: No    Emotionally Abused: No    Physically Abused: No    Sexually Abused: No    Family History: Family History  Problem Relation Age of Onset  Healthy Mother    Healthy Father    Cancer Paternal Grandmother     Current Medications:  Current Outpatient Medications:    diazepam  (VALIUM ) 10 MG tablet, Take 10 mg by mouth daily as needed., Disp: , Rfl:    fluticasone  (FLONASE ) 50 MCG/ACT nasal spray, Place 2 sprays into both nostrils daily., Disp: 16 g, Rfl: 6   gabapentin  (NEURONTIN ) 300 MG capsule, Take 1 capsule (300 mg total) by mouth 3 (three) times daily., Disp: 90 capsule, Rfl: 3   hydrOXYzine  (VISTARIL ) 25 MG capsule, Take 1 capsule (25 mg total) by mouth every 8 (eight) hours as needed., Disp: 30 capsule, Rfl: 0   predniSONE  (STERAPRED UNI-PAK 21 TAB) 10 MG (21) TBPK tablet, 10 mg DS 12 as directed, Disp: 48 tablet, Rfl: 0   Allergies: No Known Allergies  REVIEW OF SYSTEMS:   Review of Systems  Constitutional:  Negative for chills, fatigue and fever.  HENT:   Negative for lump/mass, mouth sores, nosebleeds, sore throat and trouble swallowing.   Eyes:  Negative for eye problems.  Respiratory:  Positive for shortness of breath. Negative for cough.   Cardiovascular:  Positive for palpitations. Negative for chest pain and leg swelling.  Gastrointestinal:  Negative for abdominal pain, constipation, diarrhea, nausea and vomiting.  Genitourinary:  Negative for bladder incontinence, difficulty urinating, dysuria, frequency, hematuria and nocturia.    Musculoskeletal:  Negative for arthralgias, back pain, flank pain, myalgias and neck pain.  Skin:  Negative for itching and rash.  Neurological:  Positive for dizziness, headaches and numbness (in hands).  Hematological:  Does not bruise/bleed easily.  Psychiatric/Behavioral:  Positive for sleep disturbance. Negative for depression and suicidal ideas. The patient is not nervous/anxious.   All other systems reviewed and are negative.    VITALS:   Blood pressure 123/70, pulse 99, temperature 98 F (36.7 C), temperature source Tympanic, resp. rate 20, weight 229 lb 11.5 oz (104.2 kg), SpO2 97%.  Wt Readings from Last 3 Encounters:  10/20/23 229 lb 11.5 oz (104.2 kg)  10/13/23 232 lb (105.2 kg)  09/28/23 232 lb (105.2 kg)    Body mass index is 37.08 kg/m.  Performance status (ECOG): 0 - Asymptomatic  PHYSICAL EXAM:   Physical Exam Vitals and nursing note reviewed. Exam conducted with a chaperone present.  Constitutional:      Appearance: Normal appearance.  Cardiovascular:     Rate and Rhythm: Normal rate and regular rhythm.     Pulses: Normal pulses.     Heart sounds: Normal heart sounds.  Pulmonary:     Effort: Pulmonary effort is normal.     Breath sounds: Normal breath sounds.  Abdominal:     Palpations: Abdomen is soft. There is no hepatomegaly, splenomegaly or mass.     Tenderness: There is no abdominal tenderness.  Musculoskeletal:     Right lower leg: No edema.     Left lower leg: No edema.  Lymphadenopathy:     Cervical: No cervical adenopathy.     Right cervical: No superficial, deep or posterior cervical adenopathy.    Left cervical: No superficial, deep or posterior cervical adenopathy.     Upper Body:     Right upper body: No supraclavicular or axillary adenopathy.     Left upper body: No supraclavicular or axillary adenopathy.  Neurological:     General: No focal deficit present.     Mental Status: He is alert and oriented to person, place, and time.   Psychiatric:  Mood and Affect: Mood normal.        Behavior: Behavior normal.     LABS:      Latest Ref Rng & Units 10/20/2023    1:16 PM 10/13/2023   10:14 AM 04/26/2023    9:12 AM  CBC  WBC 4.0 - 10.5 K/uL 7.8  9.4  7.6   Hemoglobin 13.0 - 17.0 g/dL 83.8  84.2  82.9   Hematocrit 39.0 - 52.0 % 46.7  47.1  50.1   Platelets 150 - 400 K/uL 296  313  260       Latest Ref Rng & Units 10/13/2023   10:14 AM 03/31/2023    9:33 AM 01/19/2023    8:20 AM  CMP  Glucose 70 - 99 mg/dL 91  90  899   BUN 6 - 24 mg/dL 21  13  21    Creatinine 0.76 - 1.27 mg/dL 8.99  9.15  9.08   Sodium 134 - 144 mmol/L 140  141  141   Potassium 3.5 - 5.2 mmol/L 4.6  4.5  4.6   Chloride 96 - 106 mmol/L 103  103  104   CO2 20 - 29 mmol/L 21  22  22    Calcium 8.7 - 10.2 mg/dL 9.1  9.2  9.0   Total Protein 6.0 - 8.5 g/dL   7.2   Total Bilirubin 0.0 - 1.2 mg/dL   0.8   Alkaline Phos 44 - 121 IU/L   85   AST 0 - 40 IU/L   26   ALT 0 - 44 IU/L   27      No results found for: CEA1, CEA / No results found for: CEA1, CEA No results found for: PSA1 No results found for: CAN199 No results found for: RJW874  Lab Results  Component Value Date   TOTALPROTELP 6.9 04/26/2023   TOTALPROTELP 6.9 04/26/2023   ALBUMINELP 3.9 04/26/2023   A1GS 0.2 04/26/2023   A2GS 0.6 04/26/2023   BETS 1.1 04/26/2023   GAMS 1.0 04/26/2023   MSPIKE Not Observed 04/26/2023   SPEI Comment 04/26/2023   No results found for: TIBC, FERRITIN, IRONPCTSAT Lab Results  Component Value Date   LDH 137 10/20/2023   LDH 109 04/26/2023     STUDIES:   No results found.

## 2023-10-20 NOTE — Patient Instructions (Signed)
[facility] at [facility] Discharge Instructions   You were seen and examined today by Dr. Ellin Saba.  He reviewed the results of your lab work which are normal/stable.   We will see you back in 6 months. We will repeat lab work at that time.    Return as scheduled.    Thank you for choosing [facility] at [facility] to provide your oncology and hematology care.  To afford each patient quality time with our provider, please arrive at least 15 minutes before your scheduled appointment time.   If you have a lab appointment with the Cancer Center please come in thru the Main Entrance and check in at the main information desk.  You need to re-schedule your appointment should you arrive 10 or more minutes late.  We strive to give you quality time with our providers, and arriving late affects you and other patients whose appointments are after yours.  Also, if you no show three or more times for appointments you may be dismissed from the clinic at the providers discretion.     Again, thank you for choosing [facility].  Our hope is that these requests will decrease the amount of time that you wait before being seen by our physicians.       _____________________________________________________________  Should you have questions after your visit to [facility], please contact our office at [phone] and follow the prompts.  Our office hours are 8:00 a.m. and 4:30 p.m. Monday - Friday.  Please note that voicemails left after 4:00 p.m. may not be returned until the following business day.  We are closed weekends and major holidays.  You do have access to a nurse 24-7, just call the main number to the clinic [phone] and do not press any options, hold on the line and a nurse will answer the phone.    For prescription refill requests, have your pharmacy contact our office and allow 72 hours.    Due to Covid, you will need  to wear a mask upon entering the hospital. If you do not have a mask, a mask will be given to you at the Main Entrance upon arrival. For doctor visits, patients may have 1 support person age 48 or older with them. For treatment visits, patients can not have anyone with them due to social distancing guidelines and our immunocompromised population.

## 2023-10-28 ENCOUNTER — Ambulatory Visit: Payer: BC Managed Care – PPO | Admitting: Internal Medicine

## 2023-10-28 ENCOUNTER — Encounter (INDEPENDENT_AMBULATORY_CARE_PROVIDER_SITE_OTHER): Payer: BC Managed Care – PPO | Admitting: Internal Medicine

## 2023-10-28 NOTE — Progress Notes (Signed)
 Erroneous encounter. Please disregard.

## 2023-10-31 ENCOUNTER — Telehealth (INDEPENDENT_AMBULATORY_CARE_PROVIDER_SITE_OTHER): Payer: BC Managed Care – PPO | Admitting: Internal Medicine

## 2023-10-31 ENCOUNTER — Encounter: Payer: Self-pay | Admitting: Internal Medicine

## 2023-10-31 DIAGNOSIS — F320 Major depressive disorder, single episode, mild: Secondary | ICD-10-CM

## 2023-10-31 DIAGNOSIS — F419 Anxiety disorder, unspecified: Secondary | ICD-10-CM | POA: Diagnosis not present

## 2023-10-31 DIAGNOSIS — G5601 Carpal tunnel syndrome, right upper limb: Secondary | ICD-10-CM | POA: Insufficient documentation

## 2023-10-31 DIAGNOSIS — F32A Depression, unspecified: Secondary | ICD-10-CM | POA: Diagnosis not present

## 2023-10-31 DIAGNOSIS — F411 Generalized anxiety disorder: Secondary | ICD-10-CM

## 2023-10-31 LAB — MISC LABCORP TEST (SEND OUT): Labcorp test code: 481080

## 2023-10-31 MED ORDER — DULOXETINE HCL 30 MG PO CPEP
30.0000 mg | ORAL_CAPSULE | Freq: Every day | ORAL | 3 refills | Status: DC
Start: 2023-10-31 — End: 2023-11-22

## 2023-10-31 MED ORDER — PREDNISONE 10 MG (21) PO TBPK: ORAL_TABLET | ORAL | 0 refills | Status: AC

## 2023-10-31 NOTE — Assessment & Plan Note (Signed)
He endorses poorly controlled symptoms of anxiety and depression.  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  Today he reports feeling more depressed and anxious.  He is using hydroxyzine as needed for anxiety relief and feels that it is mildly effective.  He would like to resume a daily medication. -Through shared decision making, duloxetine 30 mg daily has been prescribed today.  Okay to increase to 60 mg daily after 1 week.  Will arrange follow-up for 6 weeks for reassessment.

## 2023-10-31 NOTE — Progress Notes (Signed)
Virtual Visit via Video Note  I connected with [name] on 10/31/23 at  1:40 PM EST by a video enabled telemedicine application and verified that I am speaking with the correct person using two identifiers.  Patient Location: Home Provider Location: Office/Clinic  I discussed the limitations, risks, security, and privacy concerns of performing an evaluation and management service by video and the availability of in person appointments. I also discussed with the patient that there may be a patient responsible charge related to this service. The patient expressed understanding and agreed to proceed.  Subjective: PCP: Billie Lade, MD  Chief Complaint  Patient presents with   Depression    Follow up    [name] has been evaluated today for follow-up through video encounter.  Last evaluated by me in June 2024.  In the interim he has established care with hematology and has also seen orthopedic surgery.  Today he reports feeling fairly well.  His acute concern is poorly controlled symptoms of anxiety and depression.  He was previously prescribed sertraline 50 mg daily but stopped taking it last month after hospital admission with concern for possible serotonin syndrome.  He is using hydroxyzine as needed for anxiety control but would like to resume a daily medication as he feels that his depression is worsening.  Denies SI/HI.  His additional concern today is carpal tunnel syndrome.  He has been evaluated by orthopedic surgery and will undergo carpal tunnel release this spring.  Symptoms are poorly controlled currently and he is interested in a medication to bridge him until surgery.   ROS: Per HPI  Current Outpatient Medications:    DULoxetine (CYMBALTA) 30 MG capsule, Take 1 capsule (30 mg total) by mouth daily., Disp: 30 capsule, Rfl: 3   diazepam (VALIUM) 10 MG tablet, Take 10 mg by mouth daily as needed., Disp: , Rfl:    fluticasone (FLONASE) 50 MCG/ACT nasal spray, Place 2  sprays into both nostrils daily., Disp: 16 g, Rfl: 6   gabapentin (NEURONTIN) 300 MG capsule, Take 1 capsule (300 mg total) by mouth 3 (three) times daily., Disp: 90 capsule, Rfl: 3   hydrOXYzine (VISTARIL) 25 MG capsule, Take 1 capsule (25 mg total) by mouth every 8 (eight) hours as needed., Disp: 30 capsule, Rfl: 0   predniSONE (STERAPRED UNI-PAK 21 TAB) 10 MG (21) TBPK tablet, 10 mg DS 12 as directed, Disp: 48 tablet, Rfl: 0  Assessment and Plan:  Right carpal tunnel syndrome Assessment & Plan: He endorses poorly controlled pain and numbness/tingling in the right hand.  Known history of carpal tunnel syndrome and will undergo carpal tunnel release this spring.  Prednisone has previously been effective in alleviating his symptoms. Will refill prednisone pack today as previously prescribed in October 2024.  Anxiety and depression Assessment & Plan: He endorses poorly controlled symptoms of anxiety and depression.  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  Today he reports feeling more depressed and anxious.  He is using hydroxyzine as needed for anxiety relief and feels that it is mildly effective.  He would like to resume a daily medication. -Through shared decision making, duloxetine 30 mg daily has been prescribed today.  Okay to increase to 60 mg daily after 1 week.  Will arrange follow-up for 6 weeks for reassessment.   Follow Up Instructions: Return in about 6 weeks (around 12/12/2023) for anxiety and depression.   I discussed the assessment and treatment plan  with the patient. The patient was provided an opportunity to ask questions, and all were answered. The patient agreed with the plan and demonstrated an understanding of the instructions.   The patient was advised to call back or seek an in-person evaluation if the symptoms worsen or if the condition fails to improve as anticipated.  The above assessment and  management plan was discussed with the patient. The patient verbalized understanding of and has agreed to the management plan.   Billie Lade, MD

## 2023-10-31 NOTE — Assessment & Plan Note (Signed)
He endorses poorly controlled pain and numbness/tingling in the right hand.  Known history of carpal tunnel syndrome and will undergo carpal tunnel release this spring.  Prednisone has previously been effective in alleviating his symptoms. Will refill prednisone pack today as previously prescribed in October 2024.

## 2023-11-07 ENCOUNTER — Other Ambulatory Visit: Payer: Self-pay

## 2023-11-07 DIAGNOSIS — D72820 Lymphocytosis (symptomatic): Secondary | ICD-10-CM

## 2023-11-14 ENCOUNTER — Encounter: Payer: Self-pay | Admitting: Internal Medicine

## 2023-11-15 ENCOUNTER — Other Ambulatory Visit: Payer: BC Managed Care – PPO

## 2023-11-22 ENCOUNTER — Inpatient Hospital Stay: Payer: BC Managed Care – PPO | Admitting: Hematology

## 2023-11-22 ENCOUNTER — Other Ambulatory Visit: Payer: Self-pay | Admitting: Internal Medicine

## 2023-11-22 DIAGNOSIS — F320 Major depressive disorder, single episode, mild: Secondary | ICD-10-CM

## 2023-11-22 DIAGNOSIS — F411 Generalized anxiety disorder: Secondary | ICD-10-CM

## 2023-11-23 ENCOUNTER — Encounter: Payer: Self-pay | Admitting: Internal Medicine

## 2023-12-15 ENCOUNTER — Telehealth: Payer: BC Managed Care – PPO | Admitting: Internal Medicine

## 2023-12-15 ENCOUNTER — Encounter: Payer: Self-pay | Admitting: Internal Medicine

## 2023-12-15 DIAGNOSIS — F419 Anxiety disorder, unspecified: Secondary | ICD-10-CM | POA: Diagnosis not present

## 2023-12-15 DIAGNOSIS — B9689 Other specified bacterial agents as the cause of diseases classified elsewhere: Secondary | ICD-10-CM | POA: Diagnosis not present

## 2023-12-15 DIAGNOSIS — J019 Acute sinusitis, unspecified: Secondary | ICD-10-CM

## 2023-12-15 DIAGNOSIS — F32A Depression, unspecified: Secondary | ICD-10-CM | POA: Diagnosis not present

## 2023-12-15 MED ORDER — HYDROXYZINE HCL 10 MG PO TABS: 10.0000 mg | ORAL_TABLET | Freq: Three times a day (TID) | ORAL | 2 refills | Status: AC | PRN

## 2023-12-15 MED ORDER — AZITHROMYCIN 250 MG PO TABS: ORAL_TABLET | ORAL | 0 refills | Status: AC

## 2023-12-15 MED ORDER — FLUOXETINE HCL 20 MG PO TABS: 20.0000 mg | ORAL_TABLET | Freq: Every day | ORAL | 2 refills | Status: DC

## 2023-12-15 NOTE — Assessment & Plan Note (Signed)
 Evaluated today through video encounter for follow-up of anxiety and depression.  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  Cymbalta worsened his anxiety.  He is interested in additional medication options and specifically mentions fluoxetine as it has been effective for his wife. -Treatment options reviewed.  Through shared decision making, fluoxetine 20 mg daily was prescribed today.  Okay to increase to 40 mg daily after one week.  Mr. Samek is aware that fluoxetine is an SSRI similar to sertraline, which was previously discontinued in the setting of serotonin syndrome.  He acknowledges this risk but elects to proceed with starting treatment.  We also discussed BuSpar, which he reports taking as a teenager but cannot recall whether or not it was effective.  He is more interested in trying fluoxetine and BuSpar currently.  Continue as needed use of hydroxyzine for anxiety relief.  At his request, we will reduce the dose to 10 mg as needed due to side effects of drowsiness.

## 2023-12-15 NOTE — Progress Notes (Signed)
 Virtual Visit via Video Note  I connected with [name] on 12/15/23 at  4:00 PM EDT by a video enabled telemedicine application and verified that I am speaking with the correct person using two identifiers.  Patient Location: Home Provider Location: Office/Clinic  I discussed the limitations, risks, security, and privacy concerns of performing an evaluation and management service by video and the availability of in person appointments. I also discussed with the patient that there may be a patient responsible charge related to this service. The patient expressed understanding and agreed to proceed.  Subjective: PCP: Billie Lade, MD  Chief Complaint  Patient presents with   Care Management    Six week follow up   [name] has been evaluated through video encounter today for follow-up of anxiety and depression.  He was last evaluated by me on 2/17 at which time he endorsed poorly controlled symptoms of anxiety and depression.  Symptoms had previously been well-controlled with sertraline but were discontinued in January in the setting of serotonin syndrome.  He has been using hydroxyzine as needed for anxiety relief.  Duloxetine 30 mg daily was prescribed and a 6-week follow-up was arranged for reassessment.  Today he states that duloxetine significantly worsened his agitation.  He is chiefly concerned about anxiety and agitation today.  He is interested in additional medication options and specifically mentions fluoxetine as it has worked well for his wife.  We also discussed buspirone, which she says he took as a teenager but cannot recall if it was effective or not. [name] is in the process of moving to [place] and plans to establish care with a PCP in his new town shortly after moving.   ROS: Per HPI  Current Outpatient Medications:    azithromycin (ZITHROMAX Z-PAK) 250 MG tablet, Take 2 tablets (500 mg) PO today, then 1 tablet (250 mg) PO daily x4 days., Disp: 6 tablet,  Rfl: 0   FLUoxetine (PROZAC) 20 MG tablet, Take 1 tablet (20 mg total) by mouth daily., Disp: 30 tablet, Rfl: 2   hydrOXYzine (ATARAX) 10 MG tablet, Take 1 tablet (10 mg total) by mouth 3 (three) times daily as needed., Disp: 30 tablet, Rfl: 2   diazepam (VALIUM) 10 MG tablet, Take 10 mg by mouth daily as needed., Disp: , Rfl:    fluticasone (FLONASE) 50 MCG/ACT nasal spray, Place 2 sprays into both nostrils daily., Disp: 16 g, Rfl: 6   gabapentin (NEURONTIN) 300 MG capsule, Take 1 capsule (300 mg total) by mouth 3 (three) times daily., Disp: 90 capsule, Rfl: 3   predniSONE (STERAPRED UNI-PAK 21 TAB) 10 MG (21) TBPK tablet, 10 mg DS 12 as directed, Disp: 48 tablet, Rfl: 0  Assessment and Plan:  Anxiety and depression Assessment & Plan: Evaluated today through video encounter for follow-up of anxiety and depression.  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  Cymbalta worsened his anxiety.  He is interested in additional medication options and specifically mentions fluoxetine as it has been effective for his wife. -Treatment options reviewed.  Through shared decision making, fluoxetine 20 mg daily was prescribed today.  Okay to increase to 40 mg daily after one week.  [name] is aware that fluoxetine is an SSRI similar to sertraline, which was previously discontinued in the setting of serotonin syndrome.  He acknowledges this risk but elects to proceed with starting treatment.  We also discussed BuSpar, which he reports taking as a  teenager but cannot recall whether or not it was effective.  He is more interested in trying fluoxetine and BuSpar currently.  Continue as needed use of hydroxyzine for anxiety relief.  At his request, we will reduce the dose to 10 mg as needed due to side effects of drowsiness.  Acute bacterial rhinosinusitis Assessment & Plan: Multiple week history of sinus/nasal congestion and is clearing his throat  frequently.  Z-Pak prescribed for empiric treatment of bacterial rhinosinusitis.  Follow Up Instructions: Return if symptoms worsen or fail to improve.   I discussed the assessment and treatment plan with the patient. The patient was provided an opportunity to ask questions, and all were answered. The patient agreed with the plan and demonstrated an understanding of the instructions.   The patient was advised to call back or seek an in-person evaluation if the symptoms worsen or if the condition fails to improve as anticipated.  The above assessment and management plan was discussed with the patient. The patient verbalized understanding of and has agreed to the management plan.   Billie Lade, MD

## 2023-12-15 NOTE — Assessment & Plan Note (Signed)
 Multiple week history of sinus/nasal congestion and is clearing his throat frequently.  Z-Pak prescribed for empiric treatment of bacterial rhinosinusitis.

## 2024-01-06 ENCOUNTER — Other Ambulatory Visit: Payer: Self-pay | Admitting: Internal Medicine

## 2024-01-06 DIAGNOSIS — F419 Anxiety disorder, unspecified: Secondary | ICD-10-CM

## 2024-04-18 ENCOUNTER — Inpatient Hospital Stay: Payer: BC Managed Care – PPO | Attending: Hematology

## 2024-04-25 ENCOUNTER — Inpatient Hospital Stay: Payer: BC Managed Care – PPO | Admitting: Oncology
